# Patient Record
Sex: Female | Born: 1955
Health system: Southern US, Community
[De-identification: ages and names within clinical notes are randomized; demographics above are authoritative.]

## PROBLEM LIST (undated history)

## (undated) DIAGNOSIS — K5792 Diverticulitis of intestine, part unspecified, without perforation or abscess without bleeding: Secondary | ICD-10-CM

## (undated) DIAGNOSIS — K219 Gastro-esophageal reflux disease without esophagitis: Secondary | ICD-10-CM

## (undated) DIAGNOSIS — M109 Gout, unspecified: Secondary | ICD-10-CM

## (undated) DIAGNOSIS — I10 Essential (primary) hypertension: Secondary | ICD-10-CM

## (undated) DIAGNOSIS — E119 Type 2 diabetes mellitus without complications: Secondary | ICD-10-CM

## (undated) DIAGNOSIS — G589 Mononeuropathy, unspecified: Secondary | ICD-10-CM

## (undated) DIAGNOSIS — E1122 Type 2 diabetes mellitus with diabetic chronic kidney disease: Secondary | ICD-10-CM

## (undated) DIAGNOSIS — Z972 Presence of dental prosthetic device (complete) (partial): Secondary | ICD-10-CM

## (undated) DIAGNOSIS — N183 Chronic kidney disease, stage 3 unspecified: Secondary | ICD-10-CM

## (undated) DIAGNOSIS — G629 Polyneuropathy, unspecified: Secondary | ICD-10-CM

## (undated) HISTORY — PX: OOPHORECTOMY: SHX86

## (undated) HISTORY — PX: CHOLECYSTECTOMY: SHX55

## (undated) HISTORY — PX: ABDOMINAL HYSTERECTOMY: SHX81

---

## 2003-12-21 ENCOUNTER — Ambulatory Visit: Payer: Self-pay | Admitting: Internal Medicine

## 2005-12-26 ENCOUNTER — Ambulatory Visit: Payer: Self-pay | Admitting: Gastroenterology

## 2006-11-14 ENCOUNTER — Ambulatory Visit: Payer: Self-pay | Admitting: Internal Medicine

## 2008-05-03 ENCOUNTER — Ambulatory Visit: Payer: Self-pay | Admitting: Obstetrics and Gynecology

## 2008-05-07 ENCOUNTER — Ambulatory Visit: Payer: Self-pay | Admitting: Obstetrics and Gynecology

## 2008-12-06 ENCOUNTER — Ambulatory Visit: Payer: Self-pay | Admitting: Internal Medicine

## 2008-12-16 ENCOUNTER — Inpatient Hospital Stay (HOSPITAL_COMMUNITY): Admission: EM | Admit: 2008-12-16 | Discharge: 2008-12-21 | Payer: Self-pay | Admitting: Emergency Medicine

## 2009-02-09 ENCOUNTER — Ambulatory Visit (HOSPITAL_COMMUNITY): Admission: RE | Admit: 2009-02-09 | Discharge: 2009-02-09 | Payer: Self-pay | Admitting: General Surgery

## 2009-03-03 ENCOUNTER — Ambulatory Visit: Payer: Self-pay | Admitting: Sports Medicine

## 2009-03-15 ENCOUNTER — Ambulatory Visit: Payer: Self-pay | Admitting: Unknown Physician Specialty

## 2009-03-21 ENCOUNTER — Ambulatory Visit: Payer: Self-pay | Admitting: Unknown Physician Specialty

## 2010-05-15 LAB — DIFFERENTIAL
Eosinophils Relative: 3 % (ref 0–5)
Lymphocytes Relative: 36 % (ref 12–46)
Lymphs Abs: 2.5 10*3/uL (ref 0.7–4.0)

## 2010-05-15 LAB — CBC
HCT: 35.2 % — ABNORMAL LOW (ref 36.0–46.0)
Platelets: 407 10*3/uL — ABNORMAL HIGH (ref 150–400)
WBC: 7 10*3/uL (ref 4.0–10.5)

## 2010-05-15 LAB — BASIC METABOLIC PANEL
BUN: 20 mg/dL (ref 6–23)
GFR calc Af Amer: >60 mL/min (ref >60)
GFR calc non Af Amer: 50 mL/min — ABNORMAL LOW (ref >60)
Potassium: 4.6 mEq/L (ref 3.5–5.1)
Sodium: 137 mEq/L (ref 135–145)

## 2010-05-15 LAB — GLUCOSE, CAPILLARY
Glucose-Capillary: 100 mg/dL — ABNORMAL HIGH (ref 70–99)
Glucose-Capillary: 128 mg/dL — ABNORMAL HIGH (ref 70–99)

## 2010-05-17 LAB — CBC
HCT: 27.5 % — ABNORMAL LOW (ref 36.0–46.0)
HCT: 28.4 % — ABNORMAL LOW (ref 36.0–46.0)
HCT: 30.4 % — ABNORMAL LOW (ref 36.0–46.0)
HCT: 30.4 % — ABNORMAL LOW (ref 36.0–46.0)
HCT: 31.7 % — ABNORMAL LOW (ref 36.0–46.0)
Hemoglobin: 10 g/dL — ABNORMAL LOW (ref 12.0–15.0)
Hemoglobin: 10.1 g/dL — ABNORMAL LOW (ref 12.0–15.0)
Hemoglobin: 10.2 g/dL — ABNORMAL LOW (ref 12.0–15.0)
Hemoglobin: 10.3 g/dL — ABNORMAL LOW (ref 12.0–15.0)
Hemoglobin: 10.6 g/dL — ABNORMAL LOW (ref 12.0–15.0)
Hemoglobin: 9.1 g/dL — ABNORMAL LOW (ref 12.0–15.0)
Hemoglobin: 9.4 g/dL — ABNORMAL LOW (ref 12.0–15.0)
Hemoglobin: 9.9 g/dL — ABNORMAL LOW (ref 12.0–15.0)
MCHC: 33.2 g/dL (ref 30.0–36.0)
MCHC: 33.6 g/dL (ref 30.0–36.0)
MCHC: 33.8 g/dL (ref 30.0–36.0)
MCV: 79.5 fL (ref 78.0–100.0)
MCV: 79.7 fL (ref 78.0–100.0)
MCV: 79.7 fL (ref 78.0–100.0)
MCV: 80.2 fL (ref 78.0–100.0)
MCV: 80.3 fL (ref 78.0–100.0)
MCV: 80.3 fL (ref 78.0–100.0)
Platelets: 344 10*3/uL (ref 150–400)
Platelets: 353 10*3/uL (ref 150–400)
Platelets: 390 10*3/uL (ref 150–400)
RBC: 3.7 MIL/uL — ABNORMAL LOW (ref 3.87–5.11)
RBC: 3.75 MIL/uL — ABNORMAL LOW (ref 3.87–5.11)
RBC: 3.79 MIL/uL — ABNORMAL LOW (ref 3.87–5.11)
RBC: 3.81 MIL/uL — ABNORMAL LOW (ref 3.87–5.11)
RBC: 3.98 MIL/uL (ref 3.87–5.11)
RBC: 4.37 MIL/uL (ref 3.87–5.11)
RDW: 14.9 % (ref 11.5–15.5)
RDW: 15.1 % (ref 11.5–15.5)
RDW: 15.4 % (ref 11.5–15.5)
RDW: 15.6 % — ABNORMAL HIGH (ref 11.5–15.5)
RDW: 15.7 % — ABNORMAL HIGH (ref 11.5–15.5)
WBC: 6.9 10*3/uL (ref 4.0–10.5)
WBC: 6.9 10*3/uL (ref 4.0–10.5)
WBC: 7.5 10*3/uL (ref 4.0–10.5)
WBC: 8.1 10*3/uL (ref 4.0–10.5)
WBC: 8.4 10*3/uL (ref 4.0–10.5)

## 2010-05-17 LAB — DIFFERENTIAL
Basophils Absolute: 0.1 10*3/uL (ref 0.0–0.1)
Basophils Absolute: 0.1 10*3/uL (ref 0.0–0.1)
Eosinophils Absolute: 0.1 10*3/uL (ref 0.0–0.7)
Eosinophils Relative: 1 % (ref 0–5)
Eosinophils Relative: 1 % (ref 0–5)
Lymphocytes Relative: 42 % (ref 12–46)
Lymphs Abs: 3.8 10*3/uL (ref 0.7–4.0)
Monocytes Absolute: 0.6 10*3/uL (ref 0.1–1.0)
Monocytes Relative: 6 % (ref 3–12)
Neutro Abs: 4.4 10*3/uL (ref 1.7–7.7)

## 2010-05-17 LAB — COMPREHENSIVE METABOLIC PANEL
ALT: 11 U/L (ref 0–35)
AST: 15 U/L (ref 0–37)
AST: 17 U/L (ref 0–37)
Albumin: 3.4 g/dL — ABNORMAL LOW (ref 3.5–5.2)
Alkaline Phosphatase: 67 U/L (ref 39–117)
CO2: 20 mEq/L (ref 19–32)
CO2: 21 mEq/L (ref 19–32)
Calcium: 8.5 mg/dL (ref 8.4–10.5)
Chloride: 105 mEq/L (ref 96–112)
Chloride: 107 mEq/L (ref 96–112)
Creatinine, Ser: 1.01 mg/dL (ref 0.4–1.2)
Creatinine, Ser: 1.06 mg/dL (ref 0.4–1.2)
GFR calc Af Amer: 56 mL/min — ABNORMAL LOW (ref >60)
GFR calc Af Amer: >60 mL/min (ref >60)
GFR calc Af Amer: >60 mL/min (ref >60)
GFR calc non Af Amer: 46 mL/min — ABNORMAL LOW (ref >60)
GFR calc non Af Amer: 55 mL/min — ABNORMAL LOW (ref >60)
Glucose, Bld: 95 mg/dL (ref 70–99)
Potassium: 4.5 mEq/L (ref 3.5–5.1)
Potassium: 5.7 mEq/L — ABNORMAL HIGH (ref 3.5–5.1)
Sodium: 134 mEq/L — ABNORMAL LOW (ref 135–145)
Sodium: 137 mEq/L (ref 135–145)
Total Bilirubin: 0.5 mg/dL (ref 0.3–1.2)
Total Bilirubin: 0.8 mg/dL (ref 0.3–1.2)
Total Protein: 6.7 g/dL (ref 6.0–8.3)

## 2010-05-17 LAB — BASIC METABOLIC PANEL
Calcium: 8.9 mg/dL (ref 8.4–10.5)
GFR calc Af Amer: >60 mL/min (ref >60)
GFR calc non Af Amer: 50 mL/min — ABNORMAL LOW (ref >60)
GFR calc non Af Amer: >60 mL/min (ref >60)
Glucose, Bld: 97 mg/dL (ref 70–99)
Potassium: 3.8 mEq/L (ref 3.5–5.1)
Sodium: 138 mEq/L (ref 135–145)
Sodium: 139 mEq/L (ref 135–145)

## 2010-05-17 LAB — MAGNESIUM: Magnesium: 1.7 mg/dL (ref 1.5–2.5)

## 2010-05-17 LAB — PROTIME-INR: Prothrombin Time: 13.7 seconds (ref 11.6–15.2)

## 2010-05-17 LAB — GLUCOSE, CAPILLARY
Glucose-Capillary: 101 mg/dL — ABNORMAL HIGH (ref 70–99)
Glucose-Capillary: 104 mg/dL — ABNORMAL HIGH (ref 70–99)
Glucose-Capillary: 109 mg/dL — ABNORMAL HIGH (ref 70–99)
Glucose-Capillary: 113 mg/dL — ABNORMAL HIGH (ref 70–99)
Glucose-Capillary: 118 mg/dL — ABNORMAL HIGH (ref 70–99)
Glucose-Capillary: 131 mg/dL — ABNORMAL HIGH (ref 70–99)
Glucose-Capillary: 138 mg/dL — ABNORMAL HIGH (ref 70–99)
Glucose-Capillary: 97 mg/dL (ref 70–99)
Glucose-Capillary: 98 mg/dL (ref 70–99)

## 2010-05-17 LAB — TYPE AND SCREEN: Antibody Screen: NEGATIVE

## 2010-05-17 LAB — HEMOGLOBIN A1C: Hgb A1c MFr Bld: 6.7 % — ABNORMAL HIGH (ref 4.6–6.1)

## 2010-05-17 LAB — ABO/RH: ABO/RH(D): O POS

## 2010-06-07 ENCOUNTER — Ambulatory Visit: Payer: Self-pay | Admitting: Internal Medicine

## 2011-06-11 ENCOUNTER — Ambulatory Visit: Payer: Self-pay | Admitting: Internal Medicine

## 2011-10-17 ENCOUNTER — Ambulatory Visit: Payer: Self-pay | Admitting: Gastroenterology

## 2012-11-12 ENCOUNTER — Ambulatory Visit: Payer: Self-pay | Admitting: Internal Medicine

## 2013-09-07 DIAGNOSIS — K2100 Gastro-esophageal reflux disease with esophagitis, without bleeding: Secondary | ICD-10-CM | POA: Insufficient documentation

## 2013-09-07 DIAGNOSIS — M109 Gout, unspecified: Secondary | ICD-10-CM | POA: Insufficient documentation

## 2013-09-07 DIAGNOSIS — E039 Hypothyroidism, unspecified: Secondary | ICD-10-CM | POA: Insufficient documentation

## 2013-09-20 ENCOUNTER — Emergency Department (HOSPITAL_COMMUNITY)
Admission: EM | Admit: 2013-09-20 | Discharge: 2013-09-20 | Disposition: A | Discharge status: Home / Self Care | Payer: BC Managed Care – PPO | Attending: Emergency Medicine | Admitting: Emergency Medicine

## 2013-09-20 ENCOUNTER — Encounter (HOSPITAL_COMMUNITY): Payer: Self-pay | Admitting: Emergency Medicine

## 2013-09-20 DIAGNOSIS — Z8719 Personal history of other diseases of the digestive system: Secondary | ICD-10-CM | POA: Insufficient documentation

## 2013-09-20 DIAGNOSIS — G589 Mononeuropathy, unspecified: Secondary | ICD-10-CM | POA: Diagnosis not present

## 2013-09-20 DIAGNOSIS — I10 Essential (primary) hypertension: Secondary | ICD-10-CM | POA: Insufficient documentation

## 2013-09-20 DIAGNOSIS — R51 Headache: Secondary | ICD-10-CM | POA: Insufficient documentation

## 2013-09-20 DIAGNOSIS — M25473 Effusion, unspecified ankle: Secondary | ICD-10-CM | POA: Diagnosis not present

## 2013-09-20 DIAGNOSIS — M25476 Effusion, unspecified foot: Secondary | ICD-10-CM | POA: Diagnosis not present

## 2013-09-20 DIAGNOSIS — E119 Type 2 diabetes mellitus without complications: Secondary | ICD-10-CM | POA: Insufficient documentation

## 2013-09-20 DIAGNOSIS — M109 Gout, unspecified: Secondary | ICD-10-CM | POA: Insufficient documentation

## 2013-09-20 DIAGNOSIS — M25569 Pain in unspecified knee: Secondary | ICD-10-CM | POA: Insufficient documentation

## 2013-09-20 DIAGNOSIS — R519 Headache, unspecified: Secondary | ICD-10-CM

## 2013-09-20 DIAGNOSIS — M25561 Pain in right knee: Secondary | ICD-10-CM

## 2013-09-20 HISTORY — DX: Polyneuropathy, unspecified: G62.9

## 2013-09-20 HISTORY — DX: Type 2 diabetes mellitus without complications: E11.9

## 2013-09-20 HISTORY — DX: Essential (primary) hypertension: I10

## 2013-09-20 HISTORY — DX: Gout, unspecified: M10.9

## 2013-09-20 HISTORY — DX: Diverticulitis of intestine, part unspecified, without perforation or abscess without bleeding: K57.92

## 2013-09-20 MED ORDER — NAPROXEN 500 MG PO TABS: 500.0000 mg | ORAL_TABLET | Freq: Two times a day (BID) | ORAL | Status: DC

## 2013-09-20 MED ORDER — SODIUM CHLORIDE 0.9 % IV BOLUS (SEPSIS)
1000.0000 mL | Freq: Once | INTRAVENOUS | Status: AC
  Administered 2013-09-20: 1000 mL via INTRAVENOUS

## 2013-09-20 MED ORDER — ONDANSETRON HCL 4 MG/2ML IJ SOLN
4.0000 mg | Freq: Once | INTRAMUSCULAR | Status: AC
  Administered 2013-09-20: 4 mg via INTRAVENOUS
  Filled 2013-09-20: qty 2

## 2013-09-20 MED ORDER — HYDROMORPHONE HCL PF 1 MG/ML IJ SOLN
1.0000 mg | Freq: Once | INTRAMUSCULAR | Status: AC
  Administered 2013-09-20: 1 mg via INTRAVENOUS
  Filled 2013-09-20: qty 1

## 2013-09-20 MED ORDER — KETOROLAC TROMETHAMINE 30 MG/ML IJ SOLN
30.0000 mg | Freq: Once | INTRAMUSCULAR | Status: AC
  Administered 2013-09-20: 30 mg via INTRAVENOUS
  Filled 2013-09-20: qty 1

## 2013-09-20 MED ORDER — METOCLOPRAMIDE HCL 5 MG/ML IJ SOLN
10.0000 mg | Freq: Once | INTRAMUSCULAR | Status: AC
  Administered 2013-09-20: 10 mg via INTRAVENOUS
  Filled 2013-09-20: qty 2

## 2013-09-20 NOTE — ED Provider Notes (Signed)
CSN: [id number]     Arrival date & time 09/20/13  1044 History   First MD Initiated Contact with Patient 09/20/13 1115     Chief Complaint  Patient presents with  . Headache  . Leg Pain      The history is provided by the patient and a relative.   patient presents for 2 complaints today the first is right knee pain over the past 3 months.  She's had an arthrocentesis performed in the past.  She states she is scheduled for a knee replacement.  She's had a cortisone injection in her knee without improvement in her symptoms.  She is managed by her primary care physician with Vicodin.  She states that her pain in her knee continues to worsen despite the Vicodin.  No fevers or chills.  No weakness of her right leg.  Her second complaint is headache with associated photophobia and phonophobia over the past 2-3 days.  She reports nausea without vomiting.  She denies diarrhea.  No fevers or chills.  No neck pain or neck stiffness.  No recent injury or head trauma.  No use of anticoagulants.  She has a history of migraine headaches but it has been many years since she's had a headache like this.  It was not acute in onset.  Pain is moderate to severe in severity at this time.  No change in her vision.  No dental pain.  No upper respiratory symptoms.  No sinus discomfort    Past Medical History  Diagnosis Date  . Hypertension   . Diabetes mellitus without complication   . Gout   . Neuropathy   . Diverticulitis    Past Surgical History  Procedure Laterality Date  . Abdominal hysterectomy    . Cholecystectomy    . Cesarean section     History reviewed. No pertinent family history. History  Substance Use Topics  . Smoking status: Never Smoker   . Smokeless tobacco: Not on file  . Alcohol Use: No   OB History   Grav Para Term Preterm Abortions TAB SAB Ect Mult Living                 Review of Systems  Neurological: Positive for headaches.  All other systems reviewed and are  negative.     Allergies  Asa and Percocet  Home Medications   Prior to Admission medications   Medication Sig Start Date End Date Taking? Authorizing Provider  allopurinol (ZYLOPRIM) 300 MG tablet Take 300 mg by mouth daily.   Yes Historical Provider, MD  atenolol (TENORMIN) 50 MG tablet Take 50 mg by mouth daily.   Yes Historical Provider, MD  BIOTIN PO Take 1 tablet by mouth daily.   Yes Historical Provider, MD  Ferrous Gluconate (FERGON PO) Take 2 tablets by mouth daily.   Yes Historical Provider, MD  gabapentin (NEURONTIN) 100 MG capsule Take 200 mg by mouth 2 (two) times daily.   Yes Historical Provider, MD  HYDROcodone-acetaminophen (NORCO/VICODIN) 5-325 MG per tablet Take 1 tablet by mouth every 6 (six) hours as needed for moderate pain.   Yes Historical Provider, MD  Iron-Vitamin C (VITRON-C) 65-125 MG TABS Take 1 tablet by mouth daily.   Yes Historical Provider, MD  levothyroxine (SYNTHROID, LEVOTHROID) 75 MCG tablet Take 75 mcg by mouth daily before breakfast.   Yes Historical Provider, MD  metFORMIN (GLUCOPHAGE) 500 MG tablet Take 500 mg by mouth 2 (two) times daily with a meal.   Yes Historical  Provider, MD  olmesartan-hydrochlorothiazide (BENICAR HCT) 40-25 MG per tablet Take 0.5 tablets by mouth every other day.   Yes Historical Provider, MD  pantoprazole (PROTONIX) 40 MG tablet Take 40 mg by mouth daily.   Yes Historical Provider, MD  traMADol (ULTRAM) 50 MG tablet Take 50 mg by mouth every 6 (six) hours as needed for moderate pain.   Yes Historical Provider, MD  vitamin B-12 (CYANOCOBALAMIN) 1000 MCG tablet Take 1,000 mcg by mouth daily.   Yes Historical Provider, MD   BP 119/71  Pulse 74  Temp(Src) 99 F (37.2 C) (Oral)  Resp 12  Wt 270 lb (122.471 kg)  SpO2 97% Physical Exam  Nursing note and vitals reviewed. Constitutional: She is oriented to person, place, and time. She appears well-developed and well-nourished. No distress.  HENT:  Head: Normocephalic and  atraumatic.  Eyes: EOM are normal. Pupils are equal, round, and reactive to light.  Neck: Normal range of motion.  Cardiovascular: Normal rate, regular rhythm and normal heart sounds.   Pulmonary/Chest: Effort normal and breath sounds normal.  Abdominal: Soft. She exhibits no distension. There is no tenderness.  Musculoskeletal: Normal range of motion.  Very faint joint effusion of the right knee.  Normal PT and DP pulses in the right foot.  Able to range the right knee.  No significant warmth or erythema of the right knee.  No deformity noted.  Neurological: She is alert and oriented to person, place, and time.  5/5 strength in major muscle groups of  bilateral upper and lower extremities. Speech normal. No facial asymetry.   Skin: Skin is warm and dry.  Psychiatric: She has a normal mood and affect. Judgment normal.    ED Course  Procedures (including critical care time) Labs Review Labs Reviewed - No data to display  Imaging Review No results found.   EKG Interpretation None      MDM   Final diagnoses:  Nonintractable headache, unspecified chronicity pattern, unspecified headache type  Right knee pain    Suspect chronic arthritis as the cause of her right knee pain.  No indication for imaging in the setting of no trauma.  Will treat pain.  Regarding her headache this sounds more like a migraine variant.  No trauma.  Normal neuro exam.  No indication for imaging of the head at this time.  We'll manage with pain medicine and Reglan.  1:03 PM Improvement in HA. Close pcp follow up. Home with naprosyn x 4 days. Understands to return to ER for new or worsening symptoms  Hoy Morn, MD 09/20/13 1304

## 2013-09-20 NOTE — Discharge Instructions (Signed)

## 2013-09-20 NOTE — ED Notes (Signed)
Per pt and family pain right knee that has progressed to right hip and shoulder and now her head.

## 2013-11-12 DIAGNOSIS — E118 Type 2 diabetes mellitus with unspecified complications: Secondary | ICD-10-CM | POA: Insufficient documentation

## 2013-11-12 DIAGNOSIS — E7800 Pure hypercholesterolemia, unspecified: Secondary | ICD-10-CM | POA: Insufficient documentation

## 2013-11-12 DIAGNOSIS — I10 Essential (primary) hypertension: Secondary | ICD-10-CM | POA: Insufficient documentation

## 2013-12-11 ENCOUNTER — Ambulatory Visit: Payer: Self-pay | Admitting: Internal Medicine

## 2014-08-02 DIAGNOSIS — M170 Bilateral primary osteoarthritis of knee: Secondary | ICD-10-CM | POA: Insufficient documentation

## 2015-01-19 DIAGNOSIS — E1121 Type 2 diabetes mellitus with diabetic nephropathy: Secondary | ICD-10-CM | POA: Insufficient documentation

## 2015-03-04 ENCOUNTER — Other Ambulatory Visit: Payer: Self-pay

## 2015-04-05 ENCOUNTER — Other Ambulatory Visit: Payer: Self-pay | Admitting: Internal Medicine

## 2015-04-05 DIAGNOSIS — R0781 Pleurodynia: Secondary | ICD-10-CM

## 2015-04-07 ENCOUNTER — Ambulatory Visit: Payer: Self-pay

## 2015-04-11 ENCOUNTER — Other Ambulatory Visit: Payer: Self-pay | Admitting: Internal Medicine

## 2015-04-11 DIAGNOSIS — S22000A Wedge compression fracture of unspecified thoracic vertebra, initial encounter for closed fracture: Secondary | ICD-10-CM

## 2015-04-12 ENCOUNTER — Ambulatory Visit
Admission: RE | Admit: 2015-04-12 | Discharge: 2015-04-12 | Disposition: A | Discharge status: Home / Self Care | Payer: 59 | Source: Ambulatory Visit | Attending: Internal Medicine | Admitting: Internal Medicine

## 2015-04-12 DIAGNOSIS — M5124 Other intervertebral disc displacement, thoracic region: Secondary | ICD-10-CM | POA: Insufficient documentation

## 2015-04-12 DIAGNOSIS — S22000A Wedge compression fracture of unspecified thoracic vertebra, initial encounter for closed fracture: Secondary | ICD-10-CM | POA: Diagnosis present

## 2015-04-13 ENCOUNTER — Other Ambulatory Visit: Payer: Self-pay | Admitting: Internal Medicine

## 2015-04-13 DIAGNOSIS — R0781 Pleurodynia: Secondary | ICD-10-CM

## 2015-04-14 ENCOUNTER — Ambulatory Visit
Admission: RE | Admit: 2015-04-14 | Discharge: 2015-04-14 | Disposition: A | Discharge status: Home / Self Care | Payer: 59 | Source: Ambulatory Visit | Attending: Internal Medicine | Admitting: Internal Medicine

## 2015-04-14 DIAGNOSIS — I517 Cardiomegaly: Secondary | ICD-10-CM | POA: Diagnosis not present

## 2015-04-14 DIAGNOSIS — R0781 Pleurodynia: Secondary | ICD-10-CM | POA: Insufficient documentation

## 2015-04-14 MED ORDER — IOHEXOL 350 MG/ML SOLN
100.0000 mL | Freq: Once | INTRAVENOUS | Status: AC | PRN
  Administered 2015-04-14: 100 mL via INTRAVENOUS

## 2015-04-19 LAB — POCT I-STAT CREATININE: CREATININE: 1.3 mg/dL — AB (ref 0.44–1.00)

## 2015-05-03 ENCOUNTER — Encounter: Payer: Self-pay | Admitting: Pain Medicine

## 2015-05-03 ENCOUNTER — Ambulatory Visit: Payer: 59 | Attending: Pain Medicine | Admitting: Pain Medicine

## 2015-05-03 VITALS — BP 145/85 | HR 85 | Temp 98.1°F | Resp 11 | Ht 66.0 in | Wt 256.0 lb

## 2015-05-03 DIAGNOSIS — M4724 Other spondylosis with radiculopathy, thoracic region: Secondary | ICD-10-CM | POA: Insufficient documentation

## 2015-05-03 DIAGNOSIS — I10 Essential (primary) hypertension: Secondary | ICD-10-CM | POA: Diagnosis not present

## 2015-05-03 DIAGNOSIS — M549 Dorsalgia, unspecified: Secondary | ICD-10-CM | POA: Diagnosis present

## 2015-05-03 DIAGNOSIS — M5124 Other intervertebral disc displacement, thoracic region: Secondary | ICD-10-CM | POA: Insufficient documentation

## 2015-05-03 DIAGNOSIS — M5134 Other intervertebral disc degeneration, thoracic region: Secondary | ICD-10-CM

## 2015-05-03 DIAGNOSIS — M5414 Radiculopathy, thoracic region: Secondary | ICD-10-CM | POA: Insufficient documentation

## 2015-05-03 DIAGNOSIS — E7800 Pure hypercholesterolemia, unspecified: Secondary | ICD-10-CM | POA: Diagnosis not present

## 2015-05-03 DIAGNOSIS — E039 Hypothyroidism, unspecified: Secondary | ICD-10-CM | POA: Diagnosis not present

## 2015-05-03 DIAGNOSIS — G8929 Other chronic pain: Secondary | ICD-10-CM | POA: Insufficient documentation

## 2015-05-03 DIAGNOSIS — K5730 Diverticulosis of large intestine without perforation or abscess without bleeding: Secondary | ICD-10-CM | POA: Diagnosis not present

## 2015-05-03 DIAGNOSIS — M170 Bilateral primary osteoarthritis of knee: Secondary | ICD-10-CM | POA: Insufficient documentation

## 2015-05-03 DIAGNOSIS — Z90710 Acquired absence of both cervix and uterus: Secondary | ICD-10-CM | POA: Diagnosis not present

## 2015-05-03 DIAGNOSIS — K210 Gastro-esophageal reflux disease with esophagitis: Secondary | ICD-10-CM | POA: Insufficient documentation

## 2015-05-03 DIAGNOSIS — E1121 Type 2 diabetes mellitus with diabetic nephropathy: Secondary | ICD-10-CM | POA: Insufficient documentation

## 2015-05-03 DIAGNOSIS — K589 Irritable bowel syndrome without diarrhea: Secondary | ICD-10-CM | POA: Insufficient documentation

## 2015-05-03 DIAGNOSIS — M109 Gout, unspecified: Secondary | ICD-10-CM | POA: Diagnosis not present

## 2015-05-03 MED ORDER — ROPIVACAINE HCL 2 MG/ML IJ SOLN: 1.0000 mL | Freq: Once | INTRAMUSCULAR | Status: DC

## 2015-05-03 MED ORDER — DEXAMETHASONE SODIUM PHOSPHATE 10 MG/ML IJ SOLN
INTRAMUSCULAR | Status: AC
  Administered 2015-05-03: 11:00:00
  Filled 2015-05-03: qty 1

## 2015-05-03 MED ORDER — DEXAMETHASONE SODIUM PHOSPHATE 10 MG/ML IJ SOLN: 10.0000 mg | Freq: Once | INTRAMUSCULAR | Status: DC

## 2015-05-03 MED ORDER — IOHEXOL 180 MG/ML  SOLN
INTRAMUSCULAR | Status: AC
  Administered 2015-05-03: 11:00:00
  Filled 2015-05-03: qty 20

## 2015-05-03 MED ORDER — LIDOCAINE HCL (PF) 1 % IJ SOLN
INTRAMUSCULAR | Status: AC
  Administered 2015-05-03: 11:00:00
  Filled 2015-05-03: qty 5

## 2015-05-03 MED ORDER — IOHEXOL 180 MG/ML  SOLN: 5.0000 mL | Freq: Once | INTRAMUSCULAR | Status: DC | PRN

## 2015-05-03 MED ORDER — LIDOCAINE HCL (PF) 1 % IJ SOLN: 10.0000 mL | Freq: Once | INTRAMUSCULAR | Status: DC

## 2015-05-03 MED ORDER — SODIUM CHLORIDE 0.9% FLUSH: 1.0000 mL | Freq: Once | INTRAVENOUS | Status: DC

## 2015-05-03 MED ORDER — SODIUM CHLORIDE 0.9 % IJ SOLN
INTRAMUSCULAR | Status: AC
  Administered 2015-05-03: 11:00:00
  Filled 2015-05-03: qty 10

## 2015-05-03 MED ORDER — ROPIVACAINE HCL 2 MG/ML IJ SOLN
INTRAMUSCULAR | Status: AC
  Administered 2015-05-03: 11:00:00
  Filled 2015-05-03: qty 10

## 2015-05-03 NOTE — Progress Notes (Signed)
Patient's Name: Claudia Hunter MRN: [id number] DOB: September 17, 1955 DOS: 05/03/2015  Primary Reason(s) for Visit: Initial Patient Evaluation CC: Back Pain   HPI  Claudia Hunter is a 60 y.o. year old, female patient, who comes today for an initial evaluation. She has Complication of diabetes mellitus ([place]); Essential (primary) hypertension; Gout; Adult hypothyroidism; Macroalbuminuric diabetic nephropathy ([place]); Osteoarthritis of knee (Bilateral) (L>R); Pure hypercholesterolemia; Esophagitis, reflux; Diverticulosis of colon; Bulge of thoracic disc without myelopathy (Left paracentral disc protrusions at T5-6 and T6-7); Thoracic radiculitis (Location of Primary Source of Pain) (Left); and Thoracic spondylosis with radiculopathy (Left) on her problem list.. Her primarily concern today is the Back Pain   The patient comes into the clinics today referred to [place] as a "FAST TRACK" for a left sided thoracic epidural steroid injection. The patient comes in today indicating that her worst pain is over the left upper back and following a dermatomal distribution to the area right under her breast.  Reported Pain Score: 9 , clinically she looks like a 4-5/10. Reported level is inconsistent with clinical obrservations. Pain Type: Chronic pain Pain Location: Back Pain Orientation: Mid, Left Pain Descriptors / Indicators: Aching, Sharp, Stabbing Pain Frequency: Constant  Onset and Duration: Sudden, Date of onset: February 2017 and Present less than 3 months Cause of pain: Unknown Severity: No change since onset, NAS-11 at its worse: 10/10, NAS-11 at its best: 10/10, NAS-11 now: 10/10 and NAS-11 on the average: 10/10 Timing: Not influenced by the time of the day Aggravating Factors: Bending, Kneeling, Lifiting, Motion, Prolonged sitting, Prolonged standing, Twisting, Walking, Walking uphill, Walking downhill and Working Alleviating Factors: The patient denies any type of alleviating factors. Associated Problems: Pain  that wakes patient up and Pain that does not allow patient to sleep Quality of Pain: Aching, Agonizing, Annoying, Burning, Constant, Deep, Disabling, Distressing, Dreadful, Feeling of constriction, Horrible, Pressure-like, Pulsating, Sharp, Shooting, Stabbing and Tender Previous Examinations or Tests: CT scan and MRI scan Previous Treatments: Narcotic medications  Historic Controlled Substance Pharmacotherapy Review  Previously Prescribed Opioids: We are currently not prescribing for this patient. Analgesic: Hydrocodone/APAP 5/325 one tablet every 6 hours (20 mg/day); hydromorphone 2 mg twice a day (4 mg/day) MME/day: 46 mg/day Pharmacokinetics: Onset of action (Liberation/Absorption): Within expected pharmacological parameters Time to Peak effect (Distribution): Timing and results are as within normal expected parameters Duration of action (Metabolism/Excretion): Within normal limits for medication Pharmacodynamics: Analgesic Effect: More than 50% Activity Facilitation: Medication(s) allow patient to sit, stand, walk, and do the basic ADLs Perceived Effectiveness: Described as relatively effective, allowing for increase in activities of daily living (ADL) Side-effects or Adverse reactions: None reported Historical Background Evaluation: [place] PDMP: Five (5) year initial data search conducted. Historical Hospital-associated UDS Results:  No results found for: THCU, COCAINSCRNUR, PCPSCRNUR, MDMA, AMPHETMU, METHADONE, ETOH UDS Results: No UDS available, at this time UDS Interpretation: No UDS available, at this time Medication Assessment Form: Not applicable. Initial evaluation. The patient has not received any medications from our practice Treatment compliance: Not applicable. Initial evaluation Risk Assessment: Aberrant Behavior: None observed today and     Opioid Fatal Overdose Risk Factors: None detected today and     Substance Use Disorder (SUD) Risk Level: Not applicable Opioid Risk  Tool (ORT) Score: Total Score: 0 Low Risk for SUD (Score <3) Depression Scale Score: PHQ-2: PHQ-2 Total Score: 0 No depression (0) PHQ-9: PHQ-9 Total Score: 0 No depression (0-4)  Pharmacologic Plan: The patient was sent to [place] for interventional therapy, not pharmacotherapy.  Neuromodulation Therapy Review  Type: No neuromodulatory devices implanted Side-effects or Adverse reactions: No device reported Effectiveness: No device reported  Allergies  [name] is allergic to asa; celecoxib; clemastine; limonene; and percocet.  Meds  The patient has a current medication list which includes the following prescription(s): allopurinol, atenolol, biotin, cholecalciferol, cyanocobalamin, cyclobenzaprine, ferrous gluconate, gabapentin, hydrocodone-acetaminophen, iron-vitamin c, losartan-hydrochlorothiazide, metformin, naproxen, pantoprazole, tramadol, zolpidem, acetaminophen, allopurinol, atenolol, biotin, gabapentin, gabapentin, hydromorphone, levothyroxine, levothyroxine, losartan-hydrochlorothiazide, lovastatin, metformin, naproxen, olmesartan-hydrochlorothiazide, pantoprazole, tramadol, and vitamin b-12, and the following Facility-Administered Medications: dexamethasone, iohexol, lidocaine (pf), ropivacaine (pf) 2 mg/ml (0.2%), and sodium chloride flush. Requested Prescriptions    No prescriptions requested or ordered in this encounter    ROS  Cardiovascular History: Hypertension Pulmonary or Respiratory History: Negative for bronchial asthma, emphysema, chronic smoking, chronic bronchitis, sarcoidosis, tuberculosis or sleep apena Neurological History: Peripheral Neuropathy Psychological-Psychiatric History: Negative for anxiety, depression, schizophrenia, bipolar disorders or suicidal ideations or attempts Gastrointestinal History: Reflux or heatburn and Irritable Bowel Syndrome (IBS) Genitourinary History: Negative for nephrolithiasis, hematuria, renal failure or chronic kidney  disease Hematological History: Anemia Endocrine History: Non-insulin-dependent diabetes mellitus and Hypothyroidism Rheumatologic History: Osteoarthritis Musculoskeletal History: Negative for myasthenia gravis, muscular dystrophy, multiple sclerosis or malignant hyperthermia Work History: Working full time  [employer]  Medical:  Ms. Lomax  has a past medical history of Hypertension; Diabetes mellitus without complication ([place]); Gout; Neuropathy ([place]); and Diverticulitis. Family: family history includes Cancer in her mother; Heart disease in her father. Surgical:  has past surgical history that includes Abdominal hysterectomy; Cholecystectomy; and Cesarean section. Tobacco:  reports that she has never smoked. She does not have any smokeless tobacco history on file. Alcohol:  reports that she does not drink alcohol. Drug:  has no drug history on file.  Physical Exam  Vitals:  Today's Vitals   05/03/15 1048 05/03/15 1052 05/03/15 1057 05/03/15 1114  BP: 143/80 140/81 145/85   Pulse: 83 82 85   Temp:      Resp: 11 8 11    Height:      Weight:      SpO2: 100% 100% 100%   PainSc:    9   PainLoc:        Calculated BMI: Body mass index is 41.34 kg/(m^2).     General appearance: alert, cooperative, appears stated age, moderate distress and morbidly obese Eyes: PERLA Respiratory: No evidence respiratory distress, no audible rales or ronchi and no use of accessory muscles of respiration  Cervical Spine Inspection: Normal anatomy Alignment: Symetrical ROM: Adequate  Upper Extremities Inspection: No gross anomalies detected ROM: Adequate Sensory: Normal Motor: Unremarkable  Thoracic Spine Inspection: No gross anomalies detected Alignment: Symetrical ROM: Decreased Palpation: Exquisite tenderness to palpation over the upper left back and upper left flank.  Lumbar Spine Inspection: No gross anomalies detected Alignment: Symetrical ROM: Adequate Palpation: WNL Provocative  Tests: Lumbar Hyperextension and rotation test: deferred Patrick's Maneuver: deferred Gait: WNL  Lower Extremities Inspection: No gross anomalies detected ROM: Adequate Sensory: Normal Motor: Unremarkable   Assessment  Primary Diagnosis & Pertinent Problem List: The primary encounter diagnosis was Thoracic radiculitis (Location of Primary Source of Pain) (Left). Diagnoses of Bulge of thoracic disc without myelopathy (Left paracentral disc protrusions at T5-6 and T6-7) and Thoracic spondylosis with radiculopathy (Left) were also pertinent to this visit.  Visit Diagnosis: 1. Thoracic radiculitis (Location of Primary Source of Pain) (Left)   2. Bulge of thoracic disc without myelopathy (Left paracentral disc protrusions at T5-6 and T6-7)   3. Thoracic  spondylosis with radiculopathy (Left)     Assessment: No problem-specific assessment & plan notes found for this encounter.   Plan of Care  Note: As per protocol, today's visit has been an evaluation only. We have not taken over the patient's controlled substance management.  Pharmacotherapy (Medications Ordered): Meds ordered this encounter  Medications  . dexamethasone (DECADRON) injection 10 mg    Sig:   . iohexol (OMNIPAQUE) 180 MG/ML injection 5 mL    Sig:   . lidocaine (PF) (XYLOCAINE) 1 % injection 10 mL    Sig:   . sodium chloride flush (NS) 0.9 % injection 1 mL    Sig:   . ropivacaine (PF) 2 mg/ml (0.2%) (NAROPIN) epidural 1 mL    Sig:   . ropivacaine (PF) 2 mg/ml (0.2%) (NAROPIN) 2 MG/ML epidural    Sig:     Sharlett Iles, [place]: cabinet override  . iohexol (OMNIPAQUE) 180 MG/ML injection    Sig:     Sharlett Iles, Delores: cabinet override  . lidocaine (PF) (XYLOCAINE) 1 % injection    Sig:     Sharlett Iles, Delores: cabinet override  . sodium chloride 0.9 % injection    Sig:     Sharlett Iles, Delores: cabinet override  . dexamethasone (DECADRON) 10 MG/ML injection    Sig:     Sharlett Iles, Delores: cabinet override     Lab-work & Procedure Ordered: Orders Placed This Encounter  Procedures  . THORACIC EPIDURAL STEROID INJECTION    Scheduling Instructions:     Side: Left-sided     Sedation: No Sedation.     Timeframe: Today    Order Specific Question:  Where will this procedure be performed?    Answer:  [facility]    Imaging Ordered: None  Interventional Therapies: Scheduled: Left thoracic epidural steroid injection #1 under fluoroscopic guidance, no sedation (today). PRN Procedures: Possible series of 3.    Referral(s) or Consult(s): None at this point.  Medications administered during this visit: We administered ropivacaine (PF) 2 mg/ml (0.2%), iohexol, lidocaine (PF), sodium chloride, and dexamethasone.  Prescriptions ordered during this visit: New Prescriptions   No medications on file    Future Appointments Date Time Provider [place]  05/19/2015 1:20 PM Milinda Pointer, MD [facility] None    Primary Care Physician: Rusty Aus, MD Location: [facility] Facility Note by: Kathlen Brunswick. Dossie Arbour, M.D, DABA, DABAPM, DABPM, DABIPP, FIPP

## 2015-05-03 NOTE — Progress Notes (Signed)
Patient's Name: Claudia Hunter MRN: [id number] DOB: 24-Nov-1955 DOS: 05/03/2015  Primary Reason(s) for Visit: Interventional Pain Management Treatment. CC: Back Pain   Pre-Procedure Assessment:  Claudia Hunter is a 60 y.o. year old, female patient, seen today for interventional treatment. She has Complication of diabetes mellitus ([place]); Essential (primary) hypertension; Gout; Adult hypothyroidism; Macroalbuminuric diabetic nephropathy ([place]); Osteoarthritis of knee (Bilateral) (L>R); Pure hypercholesterolemia; Esophagitis, reflux; Diverticulosis of colon; Bulge of thoracic disc without myelopathy (Left paracentral disc protrusions at T5-6 and T6-7); Thoracic radiculitis (Location of Primary Source of Pain) (Left); and Thoracic spondylosis with radiculopathy (Left) on her problem list.. Her primarily concern today is the Back Pain   Today's Initial Pain Score: 10/10 Reported level of pain is incompatible with clinical obrservations. This may be secondary to a possible lack of understanding on how the pain scale works. Pain Type: Chronic pain Pain Location: Back Pain Orientation: Mid, Left Pain Descriptors / Indicators: Aching, Sharp, Stabbing Pain Frequency: Constant  Post-procedure Pain Score: 9   Verification of the correct person, correct site (including marking of site), and correct procedure were performed and confirmed by the patient.  Today's Vitals   05/03/15 1048 05/03/15 1052 05/03/15 1057 05/03/15 1114  BP: 143/80 140/81 145/85   Pulse: 83 82 85   Temp:      Resp: '11 8 11   '$ Height:      Weight:      SpO2: 100% 100% 100%   PainSc:    9   PainLoc:      Calculated BMI: Body mass index is 41.34 kg/(m^2). Allergies: She is allergic to asa; celecoxib; clemastine; limonene; and percocet.. Primary Diagnosis: Thoracic radiculitis [M54.14]  Procedure:  Type: Diagnostic Inter-Laminar Thoracic Epidural Block Region: Posterior Thoracolumbar Level: T5-6 Laterality: Left-Sided  Paramedial  Indications: 1. Thoracic radiculitis (Location of Primary Source of Pain) (Left)   2. Bulge of thoracic disc without myelopathy (Left paracentral disc protrusions at T5-6 and T6-7)   3. Thoracic spondylosis with radiculopathy (Left)     In addition, Claudia Hunter has Gout; Osteoarthritis of knee (Bilateral) (L>R); Bulge of thoracic disc without myelopathy (Left paracentral disc protrusions at T5-6 and T6-7); Thoracic radiculitis (Location of Primary Source of Pain) (Left); and Thoracic spondylosis with radiculopathy (Left) on her pertinent problem list.  Consent: Secured. Under the influence of no sedatives a written informed consent was obtained, after having provided information on the risks and possible complications. To fulfill our ethical and legal obligations, as recommended by the American Medical Association's Code of Ethics, we have provided information to the patient about our clinical impression; the nature and purpose of the treatment or procedure; the risks, benefits, and possible complications of the intervention; alternatives; the risk(s) and benefit(s) of the alternative treatment(s) or procedure(s); and the risk(s) and benefit(s) of doing nothing. The patient was provided information about the risks and possible complications associated with the procedure. These include, but are not limited to, failure to achieve desired goals, infection, bleeding, organ or nerve damage, allergic reactions, paralysis, and death. In the case of spinal procedures these may include, but are not limited to, failure to achieve desired goals, infection, bleeding, organ or nerve damage, allergic reactions, paralysis, and death. In addition, the patient was informed that Medicine is not an exact science; therefore, there is also the possibility of unforeseen risks and possible complications that may result in a catastrophic outcome. The patient indicated having understood very clearly. We have given the  patient no guarantees and we have made  no promises. Enough time was given to the patient to ask questions, all of which were answered to the patient's satisfaction.  Pre-Procedure Preparation: Safety Precautions: Allergies reviewed. Appropriate site, procedure, and patient were confirmed by following the Joint Commission's Universal Protocol (UP.01.01.01), in the form of a "Time Out". The patient was asked to confirm marked site and procedure, before commencing. The patient was asked about blood thinners, or active infections, both of which were denied. Patient was assessed for positional comfort and all pressure points were checked before starting procedure. Monitoring:  As per clinic protocol. Infection Control Precautions: Sterile technique used. Standard Universal Precautions were taken as recommended by the Department of [facility] for Disease Control and Prevention (CDC). Standard pre-surgical skin prep was conducted. Respiratory hygiene and cough etiquette was practiced. Hand hygiene observed. Safe injection practices and needle disposal techniques followed. SDV (single dose vial) medications used. Medications properly checked for expiration dates and contaminants. Personal protective equipment (PPE) used: Surgical mask. Sterile double glove technique. Radiation resistant gloves. Sterile surgical gloves.  Anesthesia, Analgesia, Anxiolysis: Type: Local Anesthesia Local Anesthetic: Lidocaine 1% Route: Infiltration ([place]/IM) IV Access: Declined Sedation: Declined  Indication(s): Analgesia    Description of Procedure Process:  Time-out: "Time-out" completed before starting procedure, as per protocol. Position: Prone Target Area: For Epidural Steroid injection(s), the target area is the  interlaminar space, initially targeting the lower border of the superior vertebral body lamina. Approach: Interlaminar approach. Area Prepped: Entire Posterior Thoracolumbar Region Prepping solution:  Duraprep (Iodine Povacrylex [0.7% available Iodine] and Isopropyl Alcohol, 74% w/w) Safety Precautions: Aspiration looking for blood return was conducted prior to all injections. At no point did we inject any substances, as a needle was being advanced. No attempts were made at seeking any paresthesias. Safe injection practices and needle disposal techniques used. Medications properly checked for expiration dates. SDV (single dose vial) medications used.   Description of the Procedure: Protocol guidelines were followed. The patient was placed in position over the fluoroscopy table. The target area was identified and the area prepped in the usual manner. Skin & deeper tissues infiltrated with local anesthetic. Appropriate amount of time allowed to pass for local anesthetics to take effect. The procedure needles were then advanced to the target area. The inferior aspect of the superior lamina was contacted and the needle walked caudad, until the lamina was cleared. The epidural space was identified using "loss-of-resistance technique" with 0.9% PF-NSS (2-28m), in a low friction 10cc LOR glass syringe. Proper needle placement was secured. Negative aspiration confirmed. Solution injected in intermittent fashion, asking for systemic symptoms every 0.5 cc of injectate. The needles were then removed and the area cleansed, making sure to leave some of the prepping solution behind to take advantage of its long term bactericidal properties. EBL: None Materials & Medications Used:  Needle(s) Used: 20g - 10cm, Tuohy-style epidural needle Medication(s): Please see chart orders for medication and dosing details.  Imaging Guidance:  Type of Imaging Technique: Fluoroscopy Guidance (Spinal) Indication(s): Assistance in needle guidance and placement for procedures requiring needle placement in or near specific anatomical locations not easily accessible without such assistance. Exposure Time: Please see nurses  notes. Contrast: Before injecting any contrast, we confirmed that the patient did not have an allergy to iodine, shellfish, or radiological contrast. Once satisfactory needle placement was completed at the desired level, radiological contrast was injected. Injection was conducted under continuous fluoroscopic guidance. Injection of contrast accomplished without complications. See chart for type and volume of contrast used.  Fluoroscopic Guidance: I was personally present in the fluoroscopy suite, where the patient was placed in position for the procedure, over the fluoroscopy-compatible table. Fluoroscopy was manipulated, using "Tunnel Vision Technique", to obtain the best possible view of the target area, on the affected side. Parallax error was corrected before commencing the procedure. A "direction-depth-direction" technique was used to introduce the needle under continuous pulsed fluoroscopic guidance. Once the target was reached, antero-posterior, oblique, and lateral fluoroscopic projection views were taken to confirm needle placement in all planes. Permanently recorded images stored by scanning into EMR. Interpretation: Intraoperative imaging interpretation by performing Physician. Adequate needle placement confirmed. Adequate needle placement confirmed in AP, lateral, & Oblique Views. Appropriate spread of contrast to desired area. No evidence of afferent or efferent intravascular uptake. No intrathecal or subarachnoid spread observed. Permanent hardcopy images in multiple planes scanned into the patient's record.  Antibiotic Prophylaxis:  Indication(s): No indications identified. Type:  Antibiotics Given (last 72 hours)    None       Post-operative Assessment:  Complications: No immediate post-treatment complications were observed. Disposition: Return to clinic for follow-up evaluation. The patient tolerated the entire procedure well. A repeat set of vitals were taken after the procedure and  the patient was kept under observation following institutional policy, for this type of procedure. Post-procedural neurological assessment was performed, showing return to baseline, prior to discharge. The patient was discharged home, once institutional criteria were met. The patient was provided with post-procedure discharge instructions, including a section on how to identify potential problems. Should any problems arise concerning this procedure, the patient was given instructions to immediately contact us, at any time, without hesitation. In any case, we plan to contact the patient by telephone for a follow-up status report regarding this interventional procedure. Comments:  No additional relevant information.  Medications administered during this visit: We administered ropivacaine (PF) 2 mg/ml (0.2%), iohexol, lidocaine (PF), sodium chloride, and dexamethasone.  Prescriptions ordered during this visit: New Prescriptions   No medications on file    Future Appointments Date Time Provider [place]  05/19/2015 1:20 PM Milinda Pointer, MD [facility] None    Primary Care Physician: Rusty Aus, MD Location: [facility] Out[facility] Note by: Kathlen Brunswick. Dossie Arbour, M.D, DABA, DABAPM, DABPM, DABIPP, FIPP  Disclaimer:  Medicine is not an exact science. The only guarantee in medicine is that nothing is guaranteed. It is important to note that the decision to proceed with this intervention was based on the information collected from the patient. The Data and conclusions were drawn from the patient's questionnaire, the interview, and the physical examination. Because the information was provided in large part by the patient, it cannot be guaranteed that it has not been purposely or unconsciously manipulated. Every effort has been made to obtain as much relevant data as possible for this evaluation. It is important to note that the conclusions that lead to this procedure are  derived in large part from the available data. Always take into account that the treatment will also be dependent on availability of resources and existing treatment guidelines, considered by other Pain Management Practitioners as being common knowledge and practice, at the time of the intervention. For Medico-Legal purposes, it is also important to point out that variation in procedural techniques and pharmacological choices are the acceptable norm. The indications, contraindications, technique, and results of the above procedure should only be interpreted and judged by a Board-Certified Interventional Pain Specialist with extensive familiarity and expertise in the same exact  procedure and technique. Attempts at providing opinions without similar or greater experience and expertise than that of the treating physician will be considered as inappropriate and unethical, and shall result in a formal complaint to the state medical board and applicable specialty societies.

## 2015-05-03 NOTE — Patient Instructions (Signed)
Epidural Steroid Injection Patient Information  Description: The epidural space surrounds the nerves as they exit the spinal cord.  In some patients, the nerves can be compressed and inflamed by a bulging disc or a tight spinal canal (spinal stenosis).  By injecting steroids into the epidural space, we can bring irritated nerves into direct contact with a potentially helpful medication.  These steroids act directly on the irritated nerves and can reduce swelling and inflammation which often leads to decreased pain.  Epidural steroids may be injected anywhere along the spine and from the neck to the low back depending upon the location of your pain.   After numbing the skin with local anesthetic (like Novocaine), a small needle is passed into the epidural space slowly.  You may experience a sensation of pressure while this is being done.  The entire block usually last less than 10 minutes.  Conditions which may be treated by epidural steroids:   Low back and leg pain  Neck and arm pain  Spinal stenosis  Post-laminectomy syndrome  Herpes zoster (shingles) pain  Pain from compression fractures  Preparation for the injection:  1. Do not eat any solid food or dairy products within 8 hours of your appointment.  2. You may drink clear liquids up to 3 hours before appointment.  Clear liquids include water, black coffee, juice or soda.  No milk or cream please. 3. You may take your regular medication, including pain medications, with a sip of water before your appointment  Diabetics should hold regular insulin (if taken separately) and take 1/2 normal NPH dos the morning of the procedure.  Carry some sugar containing items with you to your appointment. 4. A driver must accompany you and be prepared to drive you home after your procedure.  5. Bring all your current medications with your. 6. An IV may be inserted and sedation may be given at the discretion of the physician.   7. A blood pressure  cuff, EKG and other monitors will often be applied during the procedure.  Some patients may need to have extra oxygen administered for a short period. 8. You will be asked to provide medical information, including your allergies, prior to the procedure.  We must know immediately if you are taking blood thinners (like Coumadin/Warfarin)  Or if you are allergic to IV iodine contrast (dye). We must know if you could possible be pregnant.  Possible side-effects:  Bleeding from needle site  Infection (rare, may require surgery)  Nerve injury (rare)  Numbness & tingling (temporary)  Difficulty urinating (rare, temporary)  Spinal headache ( a headache worse with upright posture)  Light -headedness (temporary)  Pain at injection site (several days)  Decreased blood pressure (temporary)  Weakness in arm/leg (temporary)  Pressure sensation in back/neck (temporary)  Call if you experience:  Fever/chills associated with headache or increased back/neck pain.  Headache worsened by an upright position.  New onset weakness or numbness of an extremity below the injection site  Hives or difficulty breathing (go to the emergency room)  Inflammation or drainage at the infection site  Severe back/neck pain  Any new symptoms which are concerning to you  Please note:  Although the local anesthetic injected can often make your back or neck feel good for several hours after the injection, the pain will likely return.  It takes 3-7 days for steroids to work in the epidural space.  You may not notice any pain relief for at least that one week.    If effective, we will often do a series of three injections spaced 3-6 weeks apart to maximally decrease your pain.  After the initial series, we generally will wait several months before considering a repeat injection of the same type.  If you have any questions, please call [phone] [facility]  Discharge Instructions  General Discharge Instructions :  If you need to reach your doctor call: Monday-Friday 8:00 am - 4:00 pm at [phone] or toll free [phone].  After clinic hours [phone] to have operator reach doctor.  Bring all of your medication bottles to all your appointments in the pain clinic.  To cancel or reschedule your appointment with Pain Management please remember to call 24 hours in advance to avoid a fee.  Refer to the educational materials which you have been given on: General Risks, I had my Procedure. Discharge Instructions, Post Sedation.  Post Procedure Instructions:  The drugs you were given will stay in your system until tomorrow, so for the next 24 hours you should not drive, make any legal decisions or drink any alcoholic beverages.  You may eat anything you prefer, but it is better to start with liquids then soups and crackers, and gradually work up to solid foods.  Please notify your doctor immediately if you have any unusual bleeding, trouble breathing or pain that is not related to your normal pain.  Depending on the type of procedure that was done, some parts of your body may feel week and/or numb.  This usually clears up by tonight or the next day.  Walk with the use of an assistive device or accompanied by an adult for the 24 hours.  You may use ice on the affected area for the first 24 hours.  Put ice in a Ziploc bag and cover with a towel and place against area 15 minutes on 15 minutes off.  You may switch to heat after 24 hours. 

## 2015-05-03 NOTE — Progress Notes (Signed)
Safety precautions to be maintained throughout the outpatient stay will include: orient to surroundings, keep bed in low position, maintain call bell within reach at all times, provide assistance with transfer out of bed and ambulation.  

## 2015-05-04 ENCOUNTER — Telehealth: Payer: Self-pay | Admitting: *Deleted

## 2015-05-04 NOTE — Telephone Encounter (Signed)
Message left

## 2015-05-19 ENCOUNTER — Ambulatory Visit: Payer: 59 | Attending: Pain Medicine | Admitting: Pain Medicine

## 2015-05-19 ENCOUNTER — Encounter: Payer: Self-pay | Admitting: Pain Medicine

## 2015-05-19 VITALS — BP 140/82 | HR 79 | Temp 98.6°F | Resp 16 | Ht 66.0 in | Wt 256.0 lb

## 2015-05-19 DIAGNOSIS — E7800 Pure hypercholesterolemia, unspecified: Secondary | ICD-10-CM | POA: Diagnosis not present

## 2015-05-19 DIAGNOSIS — E1121 Type 2 diabetes mellitus with diabetic nephropathy: Secondary | ICD-10-CM | POA: Insufficient documentation

## 2015-05-19 DIAGNOSIS — K5730 Diverticulosis of large intestine without perforation or abscess without bleeding: Secondary | ICD-10-CM | POA: Diagnosis not present

## 2015-05-19 DIAGNOSIS — Z7984 Long term (current) use of oral hypoglycemic drugs: Secondary | ICD-10-CM | POA: Insufficient documentation

## 2015-05-19 DIAGNOSIS — M4724 Other spondylosis with radiculopathy, thoracic region: Secondary | ICD-10-CM | POA: Diagnosis not present

## 2015-05-19 DIAGNOSIS — M170 Bilateral primary osteoarthritis of knee: Secondary | ICD-10-CM | POA: Diagnosis not present

## 2015-05-19 DIAGNOSIS — M47894 Other spondylosis, thoracic region: Secondary | ICD-10-CM | POA: Diagnosis not present

## 2015-05-19 DIAGNOSIS — M5414 Radiculopathy, thoracic region: Secondary | ICD-10-CM

## 2015-05-19 DIAGNOSIS — E039 Hypothyroidism, unspecified: Secondary | ICD-10-CM | POA: Insufficient documentation

## 2015-05-19 DIAGNOSIS — K210 Gastro-esophageal reflux disease with esophagitis: Secondary | ICD-10-CM | POA: Diagnosis not present

## 2015-05-19 DIAGNOSIS — M109 Gout, unspecified: Secondary | ICD-10-CM | POA: Insufficient documentation

## 2015-05-19 DIAGNOSIS — I10 Essential (primary) hypertension: Secondary | ICD-10-CM | POA: Insufficient documentation

## 2015-05-19 DIAGNOSIS — M5124 Other intervertebral disc displacement, thoracic region: Secondary | ICD-10-CM | POA: Diagnosis not present

## 2015-05-19 DIAGNOSIS — Z6841 Body Mass Index (BMI) 40.0 and over, adult: Secondary | ICD-10-CM | POA: Insufficient documentation

## 2015-05-19 DIAGNOSIS — M549 Dorsalgia, unspecified: Secondary | ICD-10-CM | POA: Diagnosis present

## 2015-05-19 NOTE — Progress Notes (Signed)
Safety precautions to be maintained throughout the outpatient stay will include: orient to surroundings, keep bed in low position, maintain call bell within reach at all times, provide assistance with transfer out of bed and ambulation. Patient  Did not bring medications today.

## 2015-05-19 NOTE — Progress Notes (Signed)
Patient's Name: Claudia Hunter Patient type: Established  MRN: [id number] Service setting: Ambulatory outpatient  DOB: 10-20-1955   DOS: 05/19/2015    Primary Reason(s) for Visit: Encounter for post-procedure evaluation of chronic illness with mild to moderate exacerbation CC: Back Pain   HPI  Ms. Claudia Hunter is a 60 y.o. year old, female patient, who returns today as an established patient. She has Complication of diabetes mellitus (HCC); Essential (primary) hypertension; Gout; Adult hypothyroidism; Macroalbuminuric diabetic nephropathy (HCC); Osteoarthritis of knee (Bilateral) (L>R); Pure hypercholesterolemia; Esophagitis, reflux; Diverticulosis of colon; Bulge of thoracic disc without myelopathy (Left paracentral disc protrusions at T5-6 and T6-7); Thoracic radiculitis (Location of Primary Source of Pain) (Left); and Thoracic spondylosis with radiculopathy (Left) on her problem list.. Her primarily concern today is the Back Pain   Pain Assessment: Self-Reported Pain Score: 2  Reported level is compatible with observation Pain Type: Chronic pain Pain Location: Back Pain Orientation: Mid Pain Descriptors / Indicators: Aching, Constant Pain Frequency: Constant  The patient returns to the clinics today after last having been seen on 05/03/2015 at which time we did a T5-6 left sided thoracic epidural steroid injection under fluoroscopic guidance. This has provided the patient with considerable relief of the pain and we have decided to complete the series of 3. Today we will be scheduling her to return for her second injection. Today the patient requested some muscle relaxants and pain medication. The request was declined since the patient was referred to also has a "Fast-Track" referral. And at this time per referral, the patient is jumped over all of the other patients waiting to be seen, for the purpose of providing the patient with a very specific treatment. In order to do this, patients are not  offered medication management and they are also not evaluated to enter arm chronic pharmacological management program. The patient was informed that we would be more than glad to consider her for our medication management program as soon as we get a referral from her primary care physician for that aspect of our practice.  Date of Last Visit: 05/03/15 Service Provided on Last Visit: Procedure (left thoracic eipidural  with no sedation)  Post-Procedure Assessment  Procedure done on last visit: Left-sided T5-6 thoracic epidural steroid injection under fluoroscopic guidance, without IV sedation. Side-effects or Adverse reactions: None reported Sedation: No sedation used  Results: Ultra-Short Term Relief (First 1 hour after procedure): 10 %  No IV Analgesics or Anxiolytics given, therefore the benefit is completely due to Local Anesthetics Short Term Relief (Initial 4-6 hrs after procedure): 20 % Partial relief would suggest incomplete involvement of injected area Long Term Relief : 80 % Long-term benefit would suggest an inflammatory etiology to the pain   Current Relief (Now): 80%  Persistent relief would suggest effective anti-inflammatory effects from steroids Interpretation of Results: The results of this diagnostic injection would suggest that the patient may benefit from a series of 3. We will be scheduling the patient for this as soon as possible.  Laboratory Chemistry  Inflammation Markers No results found for: ESRSEDRATE, CRP  Renal Function Lab Results  Component Value Date   BUN 20 02/07/2009   CREATININE 1.30* 04/14/2015   GFRAA  02/07/2009    >60        The eGFR has been calculated using the MDRD equation. This calculation has not been validated in all clinical situations. eGFR's persistently <60 mL/min signify possible Chronic Kidney Disease.   GFRNONAA 50* 02/07/2009    Hepatic Function Lab  Results  Component Value Date   AST 17 12/17/2008   ALT 12 12/17/2008    ALBUMIN 3.4* 12/17/2008    Electrolytes Lab Results  Component Value Date   NA 137 02/07/2009   K 4.6 02/07/2009   CL 103 02/07/2009   CALCIUM 9.3 02/07/2009   MG 1.7 12/17/2008    Pain Modulating Vitamins No results found for: Ollie, [id number], BS9628ZM6, [id number], VITAMINB12  Coagulation Parameters Lab Results  Component Value Date   INR 1.06 12/15/2008   LABPROT 13.7 12/15/2008    Note: I personally reviewed the above data. Results shared with patient.  Meds  The patient has a current medication list which includes the following prescription(s): allopurinol, atenolol, biotin, cholecalciferol, cyanocobalamin, cyclobenzaprine, ferrous gluconate, gabapentin, hydrocodone-acetaminophen, iron-vitamin c, losartan-hydrochlorothiazide, metformin, naproxen, pantoprazole, tramadol, and zolpidem.  Current Outpatient Prescriptions on File Prior to Visit  Medication Sig  . allopurinol (ZYLOPRIM) 300 MG tablet Take 300 mg by mouth daily.  Marland Kitchen atenolol (TENORMIN) 50 MG tablet Take 50 mg by mouth daily.  Marland Kitchen BIOTIN PO Take 1 tablet by mouth daily.  . Cholecalciferol (VITAMIN D-1000 MAX ST) 1000 units tablet Take by mouth.  . Cyanocobalamin (RA VITAMIN B-12 TR) 1000 MCG TBCR Take by mouth.  . cyclobenzaprine (FLEXERIL) 10 MG tablet take 1 tablet by mouth twice a day for MUSCLES  . Ferrous Gluconate (FERGON PO) Take 2 tablets by mouth daily.  Marland Kitchen gabapentin (NEURONTIN) 300 MG capsule take 1 capsule by mouth three times a day  . HYDROcodone-acetaminophen (NORCO/VICODIN) 5-325 MG per tablet Take 1 tablet by mouth every 6 (six) hours as needed for moderate pain.  . Iron-Vitamin C (VITRON-C) 65-125 MG TABS Take 1 tablet by mouth daily.  Marland Kitchen losartan-hydrochlorothiazide (HYZAAR) 100-25 MG tablet Take by mouth.  . metFORMIN (GLUCOPHAGE) 500 MG tablet Take 500 mg by mouth 2 (two) times daily with a meal.  . naproxen (NAPROSYN) 500 MG tablet Take 1 tablet (500 mg total) by mouth 2 (two) times daily.   . pantoprazole (PROTONIX) 40 MG tablet Take 40 mg by mouth daily.  . traMADol (ULTRAM) 50 MG tablet Take 50 mg by mouth every 6 (six) hours as needed for moderate pain.  Marland Kitchen zolpidem (AMBIEN) 10 MG tablet Take by mouth.   No current facility-administered medications on file prior to visit.    ROS  Constitutional: Afebrile, no chills, well hydrated and well nourished Gastrointestinal: No upper or lower GI bleeding, no nausea, no vomiting and no acute GI distress Musculoskeletal: No acute joint swelling or redness, no acute loss of range of motion and no acute onset weakness Neurological: Denies any acute onset apraxia, no episodes of paralysis, no acute loss of coordination, no acute loss of consciousness and no acute onset aphasia, dysarthria, agnosia, or amnesia  Allergies  Ms. Langhorst is allergic to asa; celecoxib; clemastine; limonene; and percocet.  [place]  Medical:  Ms. Barnick  has a past medical history of Hypertension; Diabetes mellitus without complication ([place]); Gout; Neuropathy ([place]); and Diverticulitis. Family: family history includes Cancer in her mother; Heart disease in her father. Surgical:  has past surgical history that includes Abdominal hysterectomy; Cholecystectomy; and Cesarean section. Tobacco:  reports that she has never smoked. She does not have any smokeless tobacco history on file. Alcohol:  reports that she does not drink alcohol. Drug:  has no drug history on file.  Physical Examination  Constitutional Vitals:  Today's Vitals   05/19/15 1409 05/19/15 1411  BP: 140/82   Pulse: 79  Temp: 98.6 F (37 C)   TempSrc: Oral   Resp: 16   Height: '5\' 6"'$  (1.676 m)   Weight: 256 lb (116.121 kg)   SpO2: 100%   PainSc: 2  2   PainLoc: Back     Calculated BMI: Body mass index is 41.34 kg/(m^2). Extreme obesity (Class III) (>40 kg/m2) - 254% higher incidence of chronic pain General appearance: alert, cooperative, appears stated age, no distress and morbidly  obese Eyes: PERLA Respiratory: No evidence respiratory distress, no audible rales or ronchi and no use of accessory muscles of respiration  Cervical Spine Exam  Inspection: Normal anatomy, no anomalies observed Cervical Lordosis: Normal Alignment: Symetrical Functional ROM: Within functional limits (WFL) AROM: WFL Sensory: No sensory abnormalities reported  Upper Extremity Exam   Right  Left  Inspection: No gross anomalies detected Functional ROM: Within functional limits [facility])  Inspection: No gross anomalies detected Functional ROM: Within functional limits (WFL)  AROM: Adequate  AROM: Adequate  Sensory: Normal  Sensory: Normal  Motor: Unremarkable       Motor: Unremarkable        Thoracic Spine  Inspection: No gross anomalies detected Alignment: Symetrical AROM: Adequate  Sensory: Tender to palpation around the T5-6 area over the spine and the paravertebral muscles.  Lumbar Spne  Inspection: No gross anomalies detected Lumbar Lordosis: Normal Sacral Kyphosis: Normal Alignment: Symetrical Palpation: WNL AROM: Adequate Provocative Tests:  Lumbar Hyperextension and rotation test:  deferred Patrick's Maneuver: deferred  Gait Assessment  Gait: WNL  Lower Extremities   Right  Left  Inspection: No gross anomalies detected Functional ROM: Within functional limits [facility])  Inspection: No gross anomalies detected Functional ROM: Within functional limits (WFL)  AROM: Adequate  AROM: Adequate  Sensory:  Normal  Sensory:  Normal  Motor: Unremarkable       Motor: Unremarkable        Assessment & Plan  Primary Diagnosis & Pertinent Problem List: The primary encounter diagnosis was Thoracic radiculitis (Location of Primary Source of Pain) (Left). A diagnosis of Thoracic spondylosis with radiculopathy (Left) was also pertinent to this visit.  Visit Diagnosis: 1. Thoracic radiculitis (Location of Primary Source of Pain) (Left)   2. Thoracic spondylosis with radiculopathy  (Left)     Problem-specific Plan(s): No problem-specific assessment & plan notes found for this encounter.   Plan of Care  Pharmacotherapy (Medications Ordered): No orders of the defined types were placed in this encounter.  The patient has requested pain medications which were denied due to the fact that she was sent through a "fast-track" referral system. The patient was informed that should the primary care physician want [place] to take over, we will need a referral for that particular service.  Lab-work & Procedure Ordered: Orders Placed This Encounter  Procedures  . THORACIC EPIDURAL STEROID INJECTION    Standing Status: Future     Number of Occurrences:      Standing Expiration Date: 05/18/2016    Scheduling Instructions:     Side: Left-sided     Sedation: No Sedation.     Timeframe: ASAA    Order Specific Question:  Where will this procedure be performed?    Answer:  [facility]    Imaging Ordered: None  Interventional Therapies: Scheduled:  Left sided T5-6 thoracic epidural steroid injection #2 under fluoroscopic guidance, no sedation.    Considering:  We'll plan to complete the series of 3.    PRN Procedures:  None at this time.  Referral(s) or Consult(s): None at this time.  Medications administered during this visit: Ms. Gramlich had no medications administered during this visit.  Future Appointments Date Time Provider [place]  05/24/2015 9:45 AM Milinda Pointer, MD [facility] None    Primary Care Physician: Rusty Aus, MD Location: [facility] Outpatient Pain Management Facility Note by: Kathlen Brunswick. Dossie Arbour, M.D, DABA, DABAPM, DABPM, DABIPP, FIPP  Pain Score Disclaimer: We use the NRS-11 scale. This is a self-reported, subjective measurement of pain severity with only modest accuracy. It is used primarily to identify changes within a particular patient. It must be understood that outpatient pain scales are significantly less accurate that  those used for research, where they can be applied under ideal controlled circumstances with minimal exposure to variables. In reality, the score is likely to be a combination of pain intensity and pain affect, where pain affect describes the degree of emotional arousal or changes in action readiness caused by the sensory experience of pain. Factors such as social and work situation, setting, emotional state, anxiety levels, expectation, and prior pain experience may influence pain perception and show large inter-individual differences that may also be affected by time variables.

## 2015-05-19 NOTE — Patient Instructions (Signed)
PROCEDURE IS SCHEDULED: THORACIC EPIDURAL STEROID INJECTION WITH NO SEDATION.   Epidural Steroid Injection Patient Information  Description: The epidural space surrounds the nerves as they exit the spinal cord.  In some patients, the nerves can be compressed and inflamed by a bulging disc or a tight spinal canal (spinal stenosis).  By injecting steroids into the epidural space, we can bring irritated nerves into direct contact with a potentially helpful medication.  These steroids act directly on the irritated nerves and can reduce swelling and inflammation which often leads to decreased pain.  Epidural steroids may be injected anywhere along the spine and from the neck to the low back depending upon the location of your pain.   After numbing the skin with local anesthetic (like Novocaine), a small needle is passed into the epidural space slowly.  You may experience a sensation of pressure while this is being done.  The entire block usually last less than 10 minutes.  Conditions which may be treated by epidural steroids:   Low back and leg pain  Neck and arm pain  Spinal stenosis  Post-laminectomy syndrome  Herpes zoster (shingles) pain  Pain from compression fractures  Preparation for the injection:  1. Do not eat any solid food or dairy products within 8 hours of your appointment.  2. You may drink clear liquids up to 3 hours before appointment.  Clear liquids include water, black coffee, juice or soda.  No milk or cream please. 3. You may take your regular medication, including pain medications, with a sip of water before your appointment  Diabetics should hold regular insulin (if taken separately) and take 1/2 normal NPH dos the morning of the procedure.  Carry some sugar containing items with you to your appointment. 4. A driver must accompany you and be prepared to drive you home after your procedure.  5. Bring all your current medications with your. 6. An IV may be inserted and  sedation may be given at the discretion of the physician.   7. A blood pressure cuff, EKG and other monitors will often be applied during the procedure.  Some patients may need to have extra oxygen administered for a short period. 8. You will be asked to provide medical information, including your allergies, prior to the procedure.  We must know immediately if you are taking blood thinners (like Coumadin/Warfarin)  Or if you are allergic to IV iodine contrast (dye). We must know if you could possible be pregnant.  Possible side-effects:  Bleeding from needle site  Infection (rare, may require surgery)  Nerve injury (rare)  Numbness & tingling (temporary)  Difficulty urinating (rare, temporary)  Spinal headache ( a headache worse with upright posture)  Light -headedness (temporary)  Pain at injection site (several days)  Decreased blood pressure (temporary)  Weakness in arm/leg (temporary)  Pressure sensation in back/neck (temporary)  Call if you experience:  Fever/chills associated with headache or increased back/neck pain.  Headache worsened by an upright position.  New onset weakness or numbness of an extremity below the injection site  Hives or difficulty breathing (go to the emergency room)  Inflammation or drainage at the infection site  Severe back/neck pain  Any new symptoms which are concerning to you  Please note:  Although the local anesthetic injected can often make your back or neck feel good for several hours after the injection, the pain will likely return.  It takes 3-7 days for steroids to work in the epidural space.  You may  not notice any pain relief for at least that one week.  If effective, we will often do a series of three injections spaced 3-6 weeks apart to maximally decrease your pain.  After the initial series, we generally will wait several months before considering a repeat injection of the same type.  If you have any questions, please  call [phone] [facility]

## 2015-05-24 ENCOUNTER — Encounter: Payer: Self-pay | Admitting: Pain Medicine

## 2015-05-24 ENCOUNTER — Ambulatory Visit: Payer: 59 | Attending: Pain Medicine | Admitting: Pain Medicine

## 2015-05-24 VITALS — BP 120/89 | HR 84 | Temp 98.2°F | Resp 20 | Ht 66.0 in | Wt 256.0 lb

## 2015-05-24 DIAGNOSIS — G8929 Other chronic pain: Secondary | ICD-10-CM | POA: Diagnosis not present

## 2015-05-24 DIAGNOSIS — M5124 Other intervertebral disc displacement, thoracic region: Secondary | ICD-10-CM

## 2015-05-24 DIAGNOSIS — M109 Gout, unspecified: Secondary | ICD-10-CM | POA: Insufficient documentation

## 2015-05-24 DIAGNOSIS — M4724 Other spondylosis with radiculopathy, thoracic region: Secondary | ICD-10-CM | POA: Diagnosis not present

## 2015-05-24 DIAGNOSIS — K5730 Diverticulosis of large intestine without perforation or abscess without bleeding: Secondary | ICD-10-CM | POA: Insufficient documentation

## 2015-05-24 DIAGNOSIS — K210 Gastro-esophageal reflux disease with esophagitis: Secondary | ICD-10-CM | POA: Diagnosis not present

## 2015-05-24 DIAGNOSIS — E1121 Type 2 diabetes mellitus with diabetic nephropathy: Secondary | ICD-10-CM | POA: Insufficient documentation

## 2015-05-24 DIAGNOSIS — Z6841 Body Mass Index (BMI) 40.0 and over, adult: Secondary | ICD-10-CM | POA: Insufficient documentation

## 2015-05-24 DIAGNOSIS — M170 Bilateral primary osteoarthritis of knee: Secondary | ICD-10-CM | POA: Diagnosis not present

## 2015-05-24 DIAGNOSIS — M5414 Radiculopathy, thoracic region: Secondary | ICD-10-CM | POA: Diagnosis not present

## 2015-05-24 DIAGNOSIS — M549 Dorsalgia, unspecified: Secondary | ICD-10-CM | POA: Diagnosis present

## 2015-05-24 DIAGNOSIS — E7800 Pure hypercholesterolemia, unspecified: Secondary | ICD-10-CM | POA: Insufficient documentation

## 2015-05-24 DIAGNOSIS — M47894 Other spondylosis, thoracic region: Secondary | ICD-10-CM | POA: Diagnosis not present

## 2015-05-24 DIAGNOSIS — I10 Essential (primary) hypertension: Secondary | ICD-10-CM | POA: Insufficient documentation

## 2015-05-24 DIAGNOSIS — E039 Hypothyroidism, unspecified: Secondary | ICD-10-CM | POA: Insufficient documentation

## 2015-05-24 DIAGNOSIS — M5134 Other intervertebral disc degeneration, thoracic region: Secondary | ICD-10-CM

## 2015-05-24 MED ORDER — LIDOCAINE HCL (PF) 1 % IJ SOLN
INTRAMUSCULAR | Status: AC
  Administered 2015-05-24: 10:00:00
  Filled 2015-05-24: qty 5

## 2015-05-24 MED ORDER — SODIUM CHLORIDE 0.9 % IJ SOLN
INTRAMUSCULAR | Status: AC
  Administered 2015-05-24: 10:00:00
  Filled 2015-05-24: qty 10

## 2015-05-24 MED ORDER — ROPIVACAINE HCL 2 MG/ML IJ SOLN: 1.0000 mL | Freq: Once | INTRAMUSCULAR | Status: DC

## 2015-05-24 MED ORDER — IOPAMIDOL (ISOVUE-M 200) INJECTION 41%: 5.0000 mL | Freq: Once | INTRAMUSCULAR | Status: DC | PRN

## 2015-05-24 MED ORDER — ROPIVACAINE HCL 2 MG/ML IJ SOLN
INTRAMUSCULAR | Status: AC
  Administered 2015-05-24: 10:00:00
  Filled 2015-05-24: qty 10

## 2015-05-24 MED ORDER — LIDOCAINE HCL (PF) 1 % IJ SOLN: 10.0000 mL | Freq: Once | INTRAMUSCULAR | Status: DC

## 2015-05-24 MED ORDER — IOPAMIDOL (ISOVUE-M 200) INJECTION 41%
INTRAMUSCULAR | Status: AC
  Administered 2015-05-24: 10:00:00
  Filled 2015-05-24: qty 10

## 2015-05-24 MED ORDER — DEXAMETHASONE SODIUM PHOSPHATE 10 MG/ML IJ SOLN: 10.0000 mg | Freq: Once | INTRAMUSCULAR | Status: DC

## 2015-05-24 MED ORDER — DEXAMETHASONE SODIUM PHOSPHATE 10 MG/ML IJ SOLN
INTRAMUSCULAR | Status: AC
  Administered 2015-05-24: 10:00:00
  Filled 2015-05-24: qty 1

## 2015-05-24 MED ORDER — SODIUM CHLORIDE 0.9% FLUSH: 1.0000 mL | Freq: Once | INTRAVENOUS | Status: DC

## 2015-05-24 NOTE — Patient Instructions (Signed)
Epidural Steroid Injection Patient Information  Description: The epidural space surrounds the nerves as they exit the spinal cord.  In some patients, the nerves can be compressed and inflamed by a bulging disc or a tight spinal canal (spinal stenosis).  By injecting steroids into the epidural space, we can bring irritated nerves into direct contact with a potentially helpful medication.  These steroids act directly on the irritated nerves and can reduce swelling and inflammation which often leads to decreased pain.  Epidural steroids may be injected anywhere along the spine and from the neck to the low back depending upon the location of your pain.   After numbing the skin with local anesthetic (like Novocaine), a small needle is passed into the epidural space slowly.  You may experience a sensation of pressure while this is being done.  The entire block usually last less than 10 minutes.  Conditions which may be treated by epidural steroids:   Low back and leg pain  Neck and arm pain  Spinal stenosis  Post-laminectomy syndrome  Herpes zoster (shingles) pain  Pain from compression fractures  Preparation for the injection:  1. Do not eat any solid food or dairy products within 8 hours of your appointment.  2. You may drink clear liquids up to 3 hours before appointment.  Clear liquids include water, black coffee, juice or soda.  No milk or cream please. 3. You may take your regular medication, including pain medications, with a sip of water before your appointment  Diabetics should hold regular insulin (if taken separately) and take 1/2 normal NPH dos the morning of the procedure.  Carry some sugar containing items with you to your appointment. 4. A driver must accompany you and be prepared to drive you home after your procedure.  5. Bring all your current medications with your. 6. An IV may be inserted and sedation may be given at the discretion of the physician.   7. A blood pressure  cuff, EKG and other monitors will often be applied during the procedure.  Some patients may need to have extra oxygen administered for a short period. 8. You will be asked to provide medical information, including your allergies, prior to the procedure.  We must know immediately if you are taking blood thinners (like Coumadin/Warfarin)  Or if you are allergic to IV iodine contrast (dye). We must know if you could possible be pregnant.  Possible side-effects:  Bleeding from needle site  Infection (rare, may require surgery)  Nerve injury (rare)  Numbness & tingling (temporary)  Difficulty urinating (rare, temporary)  Spinal headache ( a headache worse with upright posture)  Light -headedness (temporary)  Pain at injection site (several days)  Decreased blood pressure (temporary)  Weakness in arm/leg (temporary)  Pressure sensation in back/neck (temporary)  Call if you experience:  Fever/chills associated with headache or increased back/neck pain.  Headache worsened by an upright position.  New onset weakness or numbness of an extremity below the injection site  Hives or difficulty breathing (go to the emergency room)  Inflammation or drainage at the infection site  Severe back/neck pain  Any new symptoms which are concerning to you  Please note:  Although the local anesthetic injected can often make your back or neck feel good for several hours after the injection, the pain will likely return.  It takes 3-7 days for steroids to work in the epidural space.  You may not notice any pain relief for at least that one week.    If effective, we will often do a series of three injections spaced 3-6 weeks apart to maximally decrease your pain.  After the initial series, we generally will wait several months before considering a repeat injection of the same type.  If you have any questions, please call [phone] [facility]  Discharge Instructions  General Discharge Instructions :  If you need to reach your doctor call: Monday-Friday 8:00 am - 4:00 pm at [phone] or toll free [phone].  After clinic hours [phone] to have operator reach doctor.  Bring all of your medication bottles to all your appointments in the pain clinic.  To cancel or reschedule your appointment with Pain Management please remember to call 24 hours in advance to avoid a fee.  Refer to the educational materials which you have been given on: General Risks, I had my Procedure. Discharge Instructions, Post Sedation.  Post Procedure Instructions:  The drugs you were given will stay in your system until tomorrow, so for the next 24 hours you should not drive, make any legal decisions or drink any alcoholic beverages.  You may eat anything you prefer, but it is better to start with liquids then soups and crackers, and gradually work up to solid foods.  Please notify your doctor immediately if you have any unusual bleeding, trouble breathing or pain that is not related to your normal pain.  Depending on the type of procedure that was done, some parts of your body may feel week and/or numb.  This usually clears up by tonight or the next day.  Walk with the use of an assistive device or accompanied by an adult for the 24 hours.  You may use ice on the affected area for the first 24 hours.  Put ice in a Ziploc bag and cover with a towel and place against area 15 minutes on 15 minutes off.  You may switch to heat after 24 hours. 

## 2015-05-24 NOTE — Progress Notes (Signed)
Safety precautions to be maintained throughout the outpatient stay will include: orient to surroundings, keep bed in low position, maintain call bell within reach at all times, provide assistance with transfer out of bed and ambulation.  

## 2015-05-24 NOTE — Progress Notes (Signed)
Patient's Name: Claudia Hunter Patient type: Established  MRN: [id number] Service setting: Ambulatory outpatient  DOB: 12-19-1955   DOS: 05/24/2015    Primary Reason(s) for Visit: Interventional Pain Management Treatment. CC: Back Pain   Procedure:  Anesthesia, Analgesia, Anxiolysis:  Type: Therapeutic Inter-Laminar Thoracic Epidural Block Region: Posterior Thoracolumbar Level: T5-6 Laterality: Left Paramedial  Indications: 1. Thoracic radiculitis (Location of Primary Source of Pain) (Left)   2. Thoracic spondylosis with radiculopathy (Left)   3. Bulge of thoracic disc without myelopathy (Left paracentral disc protrusions at T5-6 and T6-7)     Pre-procedure Pain Score: 2/10 Reported level of pain is compatible with clinical observations Post-procedure Pain Score: 0-No pain  Type: Local Anesthesia Local Anesthetic: Lidocaine 1% Route: Infiltration ([place]/IM) IV Access: Declined Sedation: Declined  Indication(s): Analgesia     Pre-Procedure Assessment:  Claudia Hunter is a 60 y.o. year old, female patient, seen today for interventional treatment. She has Complication of diabetes mellitus ([place]); Essential (primary) hypertension; Gout; Adult hypothyroidism; Macroalbuminuric diabetic nephropathy ([place]); Osteoarthritis of knee (Bilateral) (L>R); Pure hypercholesterolemia; Esophagitis, reflux; Diverticulosis of colon; Bulge of thoracic disc without myelopathy (Left paracentral disc protrusions at T5-6 and T6-7); Thoracic radiculitis (Location of Primary Source of Pain) (Left); and Thoracic spondylosis with radiculopathy (Left) on her problem list.. Her primarily concern today is the Back Pain   Pain Type: Chronic pain Pain Location: Back Pain Orientation: Mid, Left Pain Descriptors / Indicators: Aching, Constant Pain Frequency: Intermittent  Date of Last Visit: 05/19/15 Service Provided on Last Visit: Evaluation  Verification of the correct person, correct site (including marking of site),  and correct procedure were performed and confirmed by the patient.  Today's Vitals   05/24/15 0929 05/24/15 1013 05/24/15 1016 05/24/15 1021  BP: 125/75 125/71 125/78 120/89  Pulse:  86 86 84  Temp:      Resp:  '17 14 20  '$ Height:      Weight:      SpO2:  99% 99% 99%  PainSc: 2    0-No pain  PainLoc:      Calculated BMI: Body mass index is 41.34 kg/(m^2). Allergies: She is allergic to asa; celecoxib; clemastine; limonene; and percocet.. Primary Diagnosis: Thoracic radiculitis [M54.14]  Consent: Secured. Under the influence of no sedatives a written informed consent was obtained, after having provided information on the risks and possible complications. To fulfill our ethical and legal obligations, as recommended by the American Medical Association's Code of Ethics, we have provided information to the patient about our clinical impression; the nature and purpose of the treatment or procedure; the risks, benefits, and possible complications of the intervention; alternatives; the risk(s) and benefit(s) of the alternative treatment(s) or procedure(s); and the risk(s) and benefit(s) of doing nothing. The patient was provided information about the risks and possible complications associated with the procedure. These include, but are not limited to, failure to achieve desired goals, infection, bleeding, organ or nerve damage, allergic reactions, paralysis, and death. In the case of spinal procedures these may include, but are not limited to, failure to achieve desired goals, infection, bleeding, organ or nerve damage, allergic reactions, paralysis, and death. In addition, the patient was informed that Medicine is not an exact science; therefore, there is also the possibility of unforeseen risks and possible complications that may result in a catastrophic outcome. The patient indicated having understood very clearly. We have given the patient no guarantees and we have made no promises. Enough time was given  to the patient to ask questions,  all of which were answered to the patient's satisfaction.  Pre-Procedure Preparation: Safety Precautions: Allergies reviewed. Appropriate site, procedure, and patient were confirmed by following the Joint Commission's Universal Protocol (UP.01.01.01), in the form of a "Time Out". The patient was asked to confirm marked site and procedure, before commencing. The patient was asked about blood thinners, or active infections, both of which were denied. Patient was assessed for positional comfort and all pressure points were checked before starting procedure. Monitoring:  As per clinic protocol. Infection Control Precautions: Sterile technique used. Standard Universal Precautions were taken as recommended by the Department of [facility] for Disease Control and Prevention (CDC). Standard pre-surgical skin prep was conducted. Respiratory hygiene and cough etiquette was practiced. Hand hygiene observed. Safe injection practices and needle disposal techniques followed. SDV (single dose vial) medications used. Medications properly checked for expiration dates and contaminants. Personal protective equipment (PPE) used: Surgical mask. Sterile double glove technique. Radiation resistant gloves. Sterile surgical gloves.  Description of Procedure Process:  Time-out: "Time-out" completed before starting procedure, as per protocol. Position: Prone Target Area: For Epidural Steroid injection(s), the target area is the  interlaminar space, initially targeting the lower border of the superior vertebral body lamina. Approach: Interlaminar approach. Area Prepped: Entire Posterior Thoracolumbar Region Prepping solution: Duraprep (Iodine Povacrylex [0.7% available Iodine] and Isopropyl Alcohol, 74% w/w) Safety Precautions: Aspiration looking for blood return was conducted prior to all injections. At no point did we inject any substances, as a needle was being advanced. No attempts  were made at seeking any paresthesias. Safe injection practices and needle disposal techniques used. Medications properly checked for expiration dates. SDV (single dose vial) medications used.   Description of the Procedure: Protocol guidelines were followed. The patient was placed in position over the fluoroscopy table. The target area was identified and the area prepped in the usual manner. Skin & deeper tissues infiltrated with local anesthetic. Appropriate amount of time allowed to pass for local anesthetics to take effect. The procedure needles were then advanced to the target area. The inferior aspect of the superior lamina was contacted and the needle walked caudad, until the lamina was cleared. The epidural space was identified using "loss-of-resistance technique" with 0.9% PF-NSS (2-42mL), in a low friction 10cc LOR glass syringe. Proper needle placement was secured. Negative aspiration confirmed. Solution injected in intermittent fashion, asking for systemic symptoms every 0.5 cc of injectate. The needles were then removed and the area cleansed, making sure to leave some of the prepping solution behind to take advantage of its long term bactericidal properties. EBL: None Materials & Medications Used:  Needle(s) Used: 20g - 10cm, Tuohy-style epidural needle Medication(s): Please see chart orders for medication and dosing details.  Imaging Guidance:   Type of Imaging Technique: Fluoroscopy Guidance (Spinal) Indication(s): Assistance in needle guidance and placement for procedures requiring needle placement in or near specific anatomical locations not easily accessible without such assistance. Exposure Time: Please see nurses notes. Contrast: Before injecting any contrast, we confirmed that the patient did not have an allergy to iodine, shellfish, or radiological contrast. Once satisfactory needle placement was completed at the desired level, radiological contrast was injected. Injection was  conducted under continuous fluoroscopic guidance. Injection of contrast accomplished without complications. See chart for type and volume of contrast used. Fluoroscopic Guidance: I was personally present in the fluoroscopy suite, where the patient was placed in position for the procedure, over the fluoroscopy-compatible table. Fluoroscopy was manipulated, using "Tunnel Vision Technique", to obtain the best  possible view of the target area, on the affected side. Parallax error was corrected before commencing the procedure. A "direction-depth-direction" technique was used to introduce the needle under continuous pulsed fluoroscopic guidance. Once the target was reached, antero-posterior, oblique, and lateral fluoroscopic projection views were taken to confirm needle placement in all planes. Permanently recorded images stored by scanning into EMR. Interpretation: Intraoperative imaging interpretation by performing Physician. Adequate needle placement confirmed. Appropriate spread of contrast to desired area. No evidence of afferent or efferent intravascular uptake. No intrathecal or subarachnoid spread observed. Permanent hardcopy images in multiple planes scanned into the patient's record.  Antibiotic Prophylaxis:  Indication(s): No indications identified. Type:  Antibiotics Given (last 72 hours)    None       Post-operative Assessment:   Complications: No immediate post-treatment complications were observed. Disposition: Return to clinic for follow-up evaluation. The patient tolerated the entire procedure well. A repeat set of vitals were taken after the procedure and the patient was kept under observation following institutional policy, for this type of procedure. Post-procedural neurological assessment was performed, showing return to baseline, prior to discharge. The patient was discharged home, once institutional criteria were met. The patient was provided with post-procedure discharge instructions,  including a section on how to identify potential problems. Should any problems arise concerning this procedure, the patient was given instructions to immediately contact us, at any time, without hesitation. In any case, we plan to contact the patient by telephone for a follow-up status report regarding this interventional procedure. Comments:  No additional relevant information.  Medications administered during this visit: We administered lidocaine (PF), ropivacaine (PF) 2 mg/ml (0.2%), dexamethasone, sodium chloride, and iopamidol.  Prescriptions ordered during this visit: New Prescriptions   No medications on file    Future Appointments Date Time Provider [place]  06/07/2015 9:45 AM Milinda Pointer, MD [facility] None    Primary Care Physician: Rusty Aus, MD Location: [facility] Note by: Kathlen Brunswick. Dossie Arbour, M.D, DABA, DABAPM, DABPM, DABIPP, FIPP  Disclaimer:  Medicine is not an exact science. The only guarantee in medicine is that nothing is guaranteed. It is important to note that the decision to proceed with this intervention was based on the information collected from the patient. The Data and conclusions were drawn from the patient's questionnaire, the interview, and the physical examination. Because the information was provided in large part by the patient, it cannot be guaranteed that it has not been purposely or unconsciously manipulated. Every effort has been made to obtain as much relevant data as possible for this evaluation. It is important to note that the conclusions that lead to this procedure are derived in large part from the available data. Always take into account that the treatment will also be dependent on availability of resources and existing treatment guidelines, considered by other Pain Management Practitioners as being common knowledge and practice, at the time of the intervention. For Medico-Legal purposes, it is also  important to point out that variation in procedural techniques and pharmacological choices are the acceptable norm. The indications, contraindications, technique, and results of the above procedure should only be interpreted and judged by a Board-Certified Interventional Pain Specialist with extensive familiarity and expertise in the same exact procedure and technique. Attempts at providing opinions without similar or greater experience and expertise than that of the treating physician will be considered as inappropriate and unethical, and shall result in a formal complaint to the state medical board and applicable specialty societies.

## 2015-05-25 ENCOUNTER — Telehealth: Payer: Self-pay | Admitting: *Deleted

## 2015-05-25 NOTE — Telephone Encounter (Signed)
No problems post procedure. 

## 2015-06-07 ENCOUNTER — Ambulatory Visit: Payer: 59 | Attending: Pain Medicine | Admitting: Pain Medicine

## 2015-06-07 ENCOUNTER — Encounter: Payer: Self-pay | Admitting: Pain Medicine

## 2015-06-07 VITALS — BP 158/103 | HR 83 | Temp 98.3°F | Resp 16 | Ht 66.0 in | Wt 256.0 lb

## 2015-06-07 DIAGNOSIS — E039 Hypothyroidism, unspecified: Secondary | ICD-10-CM | POA: Insufficient documentation

## 2015-06-07 DIAGNOSIS — M5124 Other intervertebral disc displacement, thoracic region: Secondary | ICD-10-CM | POA: Insufficient documentation

## 2015-06-07 DIAGNOSIS — M109 Gout, unspecified: Secondary | ICD-10-CM | POA: Insufficient documentation

## 2015-06-07 DIAGNOSIS — M5414 Radiculopathy, thoracic region: Secondary | ICD-10-CM

## 2015-06-07 DIAGNOSIS — G8929 Other chronic pain: Secondary | ICD-10-CM | POA: Diagnosis not present

## 2015-06-07 DIAGNOSIS — E1121 Type 2 diabetes mellitus with diabetic nephropathy: Secondary | ICD-10-CM | POA: Insufficient documentation

## 2015-06-07 DIAGNOSIS — K210 Gastro-esophageal reflux disease with esophagitis: Secondary | ICD-10-CM | POA: Diagnosis not present

## 2015-06-07 DIAGNOSIS — M170 Bilateral primary osteoarthritis of knee: Secondary | ICD-10-CM | POA: Insufficient documentation

## 2015-06-07 DIAGNOSIS — M47894 Other spondylosis, thoracic region: Secondary | ICD-10-CM | POA: Insufficient documentation

## 2015-06-07 DIAGNOSIS — I10 Essential (primary) hypertension: Secondary | ICD-10-CM | POA: Diagnosis not present

## 2015-06-07 DIAGNOSIS — E7800 Pure hypercholesterolemia, unspecified: Secondary | ICD-10-CM | POA: Diagnosis not present

## 2015-06-07 DIAGNOSIS — K5730 Diverticulosis of large intestine without perforation or abscess without bleeding: Secondary | ICD-10-CM | POA: Insufficient documentation

## 2015-06-07 DIAGNOSIS — M549 Dorsalgia, unspecified: Secondary | ICD-10-CM | POA: Diagnosis present

## 2015-06-07 DIAGNOSIS — M4724 Other spondylosis with radiculopathy, thoracic region: Secondary | ICD-10-CM | POA: Diagnosis not present

## 2015-06-07 MED ORDER — IOPAMIDOL (ISOVUE-M 200) INJECTION 41%
10.0000 mL | Freq: Once | INTRAMUSCULAR | Status: AC
  Administered 2015-06-07: 5 mL via EPIDURAL

## 2015-06-07 MED ORDER — DEXAMETHASONE SODIUM PHOSPHATE 10 MG/ML IJ SOLN: 10.0000 mg | Freq: Once | INTRAMUSCULAR | Status: DC

## 2015-06-07 MED ORDER — ROPIVACAINE HCL 2 MG/ML IJ SOLN
2.0000 mL | Freq: Once | INTRAMUSCULAR | Status: AC
  Administered 2015-06-07: 2 mL via EPIDURAL

## 2015-06-07 MED ORDER — SODIUM CHLORIDE 0.9% FLUSH
2.0000 mL | Freq: Once | INTRAVENOUS | Status: AC
  Administered 2015-06-07: 2 mL

## 2015-06-07 MED ORDER — LIDOCAINE HCL (PF) 1 % IJ SOLN
10.0000 mL | Freq: Once | INTRAMUSCULAR | Status: AC
  Administered 2015-06-07: 10 mL

## 2015-06-07 NOTE — Patient Instructions (Signed)
Epidural Steroid Injection Patient Information  Description: The epidural space surrounds the nerves as they exit the spinal cord.  In some patients, the nerves can be compressed and inflamed by a bulging disc or a tight spinal canal (spinal stenosis).  By injecting steroids into the epidural space, we can bring irritated nerves into direct contact with a potentially helpful medication.  These steroids act directly on the irritated nerves and can reduce swelling and inflammation which often leads to decreased pain.  Epidural steroids may be injected anywhere along the spine and from the neck to the low back depending upon the location of your pain.   After numbing the skin with local anesthetic (like Novocaine), a small needle is passed into the epidural space slowly.  You may experience a sensation of pressure while this is being done.  The entire block usually last less than 10 minutes.  Conditions which may be treated by epidural steroids:   Low back and leg pain  Neck and arm pain  Spinal stenosis  Post-laminectomy syndrome  Herpes zoster (shingles) pain  Pain from compression fractures  Preparation for the injection:  1. Do not eat any solid food or dairy products within 8 hours of your appointment.  2. You may drink clear liquids up to 3 hours before appointment.  Clear liquids include water, black coffee, juice or soda.  No milk or cream please. 3. You may take your regular medication, including pain medications, with a sip of water before your appointment  Diabetics should hold regular insulin (if taken separately) and take 1/2 normal NPH dos the morning of the procedure.  Carry some sugar containing items with you to your appointment. 4. A driver must accompany you and be prepared to drive you home after your procedure.  5. Bring all your current medications with your. 6. An IV may be inserted and sedation may be given at the discretion of the physician.   7. A blood pressure  cuff, EKG and other monitors will often be applied during the procedure.  Some patients may need to have extra oxygen administered for a short period. 8. You will be asked to provide medical information, including your allergies, prior to the procedure.  We must know immediately if you are taking blood thinners (like Coumadin/Warfarin)  Or if you are allergic to IV iodine contrast (dye). We must know if you could possible be pregnant.  Possible side-effects:  Bleeding from needle site  Infection (rare, may require surgery)  Nerve injury (rare)  Numbness & tingling (temporary)  Difficulty urinating (rare, temporary)  Spinal headache ( a headache worse with upright posture)  Light -headedness (temporary)  Pain at injection site (several days)  Decreased blood pressure (temporary)  Weakness in arm/leg (temporary)  Pressure sensation in back/neck (temporary)  Call if you experience:  Fever/chills associated with headache or increased back/neck pain.  Headache worsened by an upright position.  New onset weakness or numbness of an extremity below the injection site  Hives or difficulty breathing (go to the emergency room)  Inflammation or drainage at the infection site  Severe back/neck pain  Any new symptoms which are concerning to you  Please note:  Although the local anesthetic injected can often make your back or neck feel good for several hours after the injection, the pain will likely return.  It takes 3-7 days for steroids to work in the epidural space.  You may not notice any pain relief for at least that one week.    If effective, we will often do a series of three injections spaced 3-6 weeks apart to maximally decrease your pain.  After the initial series, we generally will wait several months before considering a repeat injection of the same type.  If you have any questions, please call [phone] [facility]  Discharge Instructions  General Discharge Instructions :  If you need to reach your doctor call: Monday-Friday 8:00 am - 4:00 pm at [phone] or toll free [phone].  After clinic hours [phone] to have operator reach doctor.  Bring all of your medication bottles to all your appointments in the pain clinic.  To cancel or reschedule your appointment with Pain Management please remember to call 24 hours in advance to avoid a fee.  Refer to the educational materials which you have been given on: General Risks, I had my Procedure. Discharge Instructions, Post Sedation.  Post Procedure Instructions:  The drugs you were given will stay in your system until tomorrow, so for the next 24 hours you should not drive, make any legal decisions or drink any alcoholic beverages.  You may eat anything you prefer, but it is better to start with liquids then soups and crackers, and gradually work up to solid foods.  Please notify your doctor immediately if you have any unusual bleeding, trouble breathing or pain that is not related to your normal pain.  Depending on the type of procedure that was done, some parts of your body may feel week and/or numb.  This usually clears up by tonight or the next day.  Walk with the use of an assistive device or accompanied by an adult for the 24 hours.  You may use ice on the affected area for the first 24 hours.  Put ice in a Ziploc bag and cover with a towel and place against area 15 minutes on 15 minutes off.  You may switch to heat after 24 hours. 

## 2015-06-07 NOTE — Progress Notes (Signed)
Safety precautions to be maintained throughout the outpatient stay will include: orient to surroundings, keep bed in low position, maintain call bell within reach at all times, provide assistance with transfer out of bed and ambulation.  

## 2015-06-07 NOTE — Progress Notes (Signed)
Patient's Name: Claudia Hunter  Patient type: Established  MRN: [id number]  Service setting: Ambulatory outpatient  DOB: 07/20/1955  Location: [facility]  DOS: 06/07/2015  Primary Care Physician: Rusty Aus, MD  Note by: Kathlen Brunswick. Dossie Arbour, M.D, DABA, DABAPM, DABPM, DABIPP, FIPP  Referring Physician: Rusty Aus, MD  Specialty: Board-Certified Interventional Pain Management     Primary Reason(s) for Visit: Interventional Pain Management Treatment. CC: Back Pain  Primary Diagnosis: Thoracic radiculitis [M54.14]   Procedure:  Anesthesia, Analgesia, Anxiolysis:  Type: Therapeutic Inter-Laminar Thoracic Epidural Block Region: Posterior Thoracolumbar Level: T5-6 Laterality: Left Paramedial  Indications: 1. Thoracic radiculitis (Location of Primary Source of Pain) (Left)   2. Thoracic spondylosis with radiculopathy (Left)     Pre-procedure Pain Score: 3/10 Reported level of pain is compatible with clinical observations Post-procedure Pain Score: 3   Type: Local Anesthesia Local Anesthetic: Lidocaine 1% Route: Infiltration ([place]/IM) IV Access: Declined Sedation: Declined  Indication(s): Analgesia     Pre-Procedure Assessment:  Claudia Hunter is a 60 y.o. year old, female patient, seen today for interventional treatment. She has Complication of diabetes mellitus ([place]); Essential (primary) hypertension; Gout; Adult hypothyroidism; Macroalbuminuric diabetic nephropathy ([place]); Osteoarthritis of knee (Bilateral) (L>R); Pure hypercholesterolemia; Esophagitis, reflux; Diverticulosis of colon; Bulge of thoracic disc without myelopathy (Left paracentral disc protrusions at T5-6 and T6-7); Thoracic radiculitis (Location of Primary Source of Pain) (Left); and Thoracic spondylosis with radiculopathy (Left) on her problem list.. Her primarily concern today is the Back Pain   Pain Type: Chronic pain Pain Location: Back Pain Orientation: Lower, Mid Pain Descriptors /  Indicators: Aching, Constant Pain Frequency: Constant  Date of Last Visit: 05/24/15 Service Provided on Last Visit: Procedure (TESI)  Verification of the correct person, correct site (including marking of site), and correct procedure were performed and confirmed by the patient.  Consent: Secured. Under the influence of no sedatives a written informed consent was obtained, after having provided information on the risks and possible complications. To fulfill our ethical and legal obligations, as recommended by the American Medical Association's Code of Ethics, we have provided information to the patient about our clinical impression; the nature and purpose of the treatment or procedure; the risks, benefits, and possible complications of the intervention; alternatives; the risk(s) and benefit(s) of the alternative treatment(s) or procedure(s); and the risk(s) and benefit(s) of doing nothing. The patient was provided information about the risks and possible complications associated with the procedure. These include, but are not limited to, failure to achieve desired goals, infection, bleeding, organ or nerve damage, allergic reactions, paralysis, and death. In the case of spinal procedures these may include, but are not limited to, failure to achieve desired goals, infection, bleeding, organ or nerve damage, allergic reactions, paralysis, and death. In addition, the patient was informed that Medicine is not an exact science; therefore, there is also the possibility of unforeseen risks and possible complications that may result in a catastrophic outcome. The patient indicated having understood very clearly. We have given the patient no guarantees and we have made no promises. Enough time was given to the patient to ask questions, all of which were answered to the patient's satisfaction.  Consent Attestation: I, the ordering provider, attest that I have discussed with the patient the benefits, risks,  side-effects, alternatives, likelihood of achieving goals, and potential problems during recovery for the procedure that I have provided informed consent.  Pre-Procedure Preparation: Safety Precautions: Allergies reviewed. Appropriate site, procedure, and patient were confirmed by  following the Joint Commission's Universal Protocol (UP.01.01.01), in the form of a "Time Out". The patient was asked to confirm marked site and procedure, before commencing. The patient was asked about blood thinners, or active infections, both of which were denied. Patient was assessed for positional comfort and all pressure points were checked before starting procedure. Allergies: She is allergic to asa; celecoxib; clemastine; limonene; and percocet.. Infection Control Precautions: Sterile technique used. Standard Universal Precautions were taken as recommended by the Department of [facility] for Disease Control and Prevention (CDC). Standard pre-surgical skin prep was conducted. Respiratory hygiene and cough etiquette was practiced. Hand hygiene observed. Safe injection practices and needle disposal techniques followed. SDV (single dose vial) medications used. Medications properly checked for expiration dates and contaminants. Personal protective equipment (PPE) used: Surgical mask. Sterile Radiation-resistant gloves. Monitoring:  As per clinic protocol. Filed Vitals:   06/07/15 1002 06/07/15 1007 06/07/15 1012 06/07/15 1016  BP: 151/82 147/86 152/80 158/103  Pulse: 85 84 81 83  Temp:      Resp: '14 12 15 16  '$ Height:      Weight:      SpO2: 98% 97% 97% 98%  Calculated BMI: Body mass index is 41.34 kg/(m^2).  Description of Procedure Process:  Time-out: "Time-out" completed before starting procedure, as per protocol. Position: Prone Target Area: For Epidural Steroid injection(s), the target area is the  interlaminar space, initially targeting the lower border of the superior vertebral body  lamina. Approach: Interlaminar approach. Area Prepped: Entire Posterior Thoracolumbar Region Prepping solution: Duraprep (Iodine Povacrylex [0.7% available Iodine] and Isopropyl Alcohol, 74% w/w) Safety Precautions: Aspiration looking for blood return was conducted prior to all injections. At no point did we inject any substances, as a needle was being advanced. No attempts were made at seeking any paresthesias. Safe injection practices and needle disposal techniques used. Medications properly checked for expiration dates. SDV (single dose vial) medications used.   Description of the Procedure: Protocol guidelines were followed. The patient was placed in position over the fluoroscopy table. The target area was identified and the area prepped in the usual manner. Skin & deeper tissues infiltrated with local anesthetic. Appropriate amount of time allowed to pass for local anesthetics to take effect. The procedure needles were then advanced to the target area. The inferior aspect of the superior lamina was contacted and the needle walked caudad, until the lamina was cleared. The epidural space was identified using "loss-of-resistance technique" with 0.9% PF-NSS (2-80m), in a low friction 10cc LOR glass syringe. Proper needle placement was secured. Negative aspiration confirmed. Solution injected in intermittent fashion, asking for systemic symptoms every 0.5 cc of injectate. The needles were then removed and the area cleansed, making sure to leave some of the prepping solution behind to take advantage of its long term bactericidal properties. EBL: None Materials & Medications Used:  Needle(s) Used: 20g - 10cm, Tuohy-style epidural needle Medication(s): Please see chart orders for medication and dosing details.  Imaging Guidance:   Type of Imaging Technique: Fluoroscopy Guidance (Spinal) Indication(s): Assistance in needle guidance and placement for procedures requiring needle placement in or near specific  anatomical locations not easily accessible without such assistance. Exposure Time: Please see nurses notes. Contrast: Before injecting any contrast, we confirmed that the patient did not have an allergy to iodine, shellfish, or radiological contrast. Once satisfactory needle placement was completed at the desired level, radiological contrast was injected. Injection was conducted under continuous fluoroscopic guidance. Injection of contrast accomplished without complications. See  chart for type and volume of contrast used. Fluoroscopic Guidance: I was personally present in the fluoroscopy suite, where the patient was placed in position for the procedure, over the fluoroscopy-compatible table. Fluoroscopy was manipulated, using "Tunnel Vision Technique", to obtain the best possible view of the target area, on the affected side. Parallax error was corrected before commencing the procedure. A "direction-depth-direction" technique was used to introduce the needle under continuous pulsed fluoroscopic guidance. Once the target was reached, antero-posterior, oblique, and lateral fluoroscopic projection views were taken to confirm needle placement in all planes. Permanently recorded images stored by scanning into EMR. Interpretation: Intraoperative imaging interpretation by performing Physician. Adequate needle placement confirmed. Adequate needle placement confirmed in AP, lateral, & Oblique Views. Appropriate spread of contrast to desired area. No evidence of afferent or efferent intravascular uptake. No intrathecal or subarachnoid spread observed. Spread is WNL. No evidence of epidural restrictions. Permanent hardcopy images in multiple planes scanned into the patient's record.  Antibiotic Prophylaxis:  Indication(s): No indications identified. Type:  Antibiotics Given (last 72 hours)    None       Post-operative Assessment:   Complications: No immediate post-treatment complications were  observed. Disposition: Return to clinic for follow-up evaluation. The patient tolerated the entire procedure well. A repeat set of vitals were taken after the procedure and the patient was kept under observation following institutional policy, for this type of procedure. Post-procedural neurological assessment was performed, showing return to baseline, prior to discharge. The patient was discharged home, once institutional criteria were met. The patient was provided with post-procedure discharge instructions, including a section on how to identify potential problems. Should any problems arise concerning this procedure, the patient was given instructions to immediately contact us, at any time, without hesitation. In any case, we plan to contact the patient by telephone for a follow-up status report regarding this interventional procedure. Comments:  No additional relevant information.  Medications administered during this visit: We administered iopamidol, lidocaine (PF), sodium chloride flush, and ropivacaine (PF) 2 mg/ml (0.2%).  Prescriptions ordered during this visit: New Prescriptions   No medications on file    Future Appointments Date Time Provider [place]  06/22/2015 8:00 AM Milinda Pointer, MD [facility] None    Primary Care Physician: Rusty Aus, MD Location: [facility] Note by: Kathlen Brunswick. Dossie Arbour, M.D, DABA, DABAPM, DABPM, DABIPP, FIPP  Disclaimer:  Medicine is not an exact science. The only guarantee in medicine is that nothing is guaranteed. It is important to note that the decision to proceed with this intervention was based on the information collected from the patient. The Data and conclusions were drawn from the patient's questionnaire, the interview, and the physical examination. Because the information was provided in large part by the patient, it cannot be guaranteed that it has not been purposely or unconsciously manipulated. Every  effort has been made to obtain as much relevant data as possible for this evaluation. It is important to note that the conclusions that lead to this procedure are derived in large part from the available data. Always take into account that the treatment will also be dependent on availability of resources and existing treatment guidelines, considered by other Pain Management Practitioners as being common knowledge and practice, at the time of the intervention. For Medico-Legal purposes, it is also important to point out that variation in procedural techniques and pharmacological choices are the acceptable norm. The indications, contraindications, technique, and results of the above procedure should only be interpreted  and judged by a Board-Certified Interventional Pain Specialist with extensive familiarity and expertise in the same exact procedure and technique. Attempts at providing opinions without similar or greater experience and expertise than that of the treating physician will be considered as inappropriate and unethical, and shall result in a formal complaint to the state medical board and applicable specialty societies.

## 2015-06-08 ENCOUNTER — Telehealth: Payer: Self-pay | Admitting: *Deleted

## 2015-06-08 NOTE — Telephone Encounter (Signed)
Spoke with patient, denies any questions or concerns from procedure on yesterday.

## 2015-06-22 ENCOUNTER — Ambulatory Visit: Payer: 59 | Admitting: Pain Medicine

## 2015-06-29 ENCOUNTER — Ambulatory Visit: Payer: 59 | Attending: Pain Medicine | Admitting: Pain Medicine

## 2015-06-29 ENCOUNTER — Encounter: Payer: Self-pay | Admitting: Pain Medicine

## 2015-06-29 VITALS — BP 146/97 | HR 88 | Temp 98.8°F | Resp 18 | Ht 66.0 in | Wt 256.0 lb

## 2015-06-29 DIAGNOSIS — E039 Hypothyroidism, unspecified: Secondary | ICD-10-CM | POA: Insufficient documentation

## 2015-06-29 DIAGNOSIS — M109 Gout, unspecified: Secondary | ICD-10-CM | POA: Diagnosis not present

## 2015-06-29 DIAGNOSIS — Z6841 Body Mass Index (BMI) 40.0 and over, adult: Secondary | ICD-10-CM | POA: Diagnosis not present

## 2015-06-29 DIAGNOSIS — K5730 Diverticulosis of large intestine without perforation or abscess without bleeding: Secondary | ICD-10-CM | POA: Insufficient documentation

## 2015-06-29 DIAGNOSIS — M4724 Other spondylosis with radiculopathy, thoracic region: Secondary | ICD-10-CM | POA: Insufficient documentation

## 2015-06-29 DIAGNOSIS — M5134 Other intervertebral disc degeneration, thoracic region: Secondary | ICD-10-CM

## 2015-06-29 DIAGNOSIS — M549 Dorsalgia, unspecified: Secondary | ICD-10-CM | POA: Diagnosis present

## 2015-06-29 DIAGNOSIS — M5414 Radiculopathy, thoracic region: Secondary | ICD-10-CM

## 2015-06-29 DIAGNOSIS — M5124 Other intervertebral disc displacement, thoracic region: Secondary | ICD-10-CM | POA: Insufficient documentation

## 2015-06-29 DIAGNOSIS — G8929 Other chronic pain: Secondary | ICD-10-CM | POA: Insufficient documentation

## 2015-06-29 DIAGNOSIS — E1140 Type 2 diabetes mellitus with diabetic neuropathy, unspecified: Secondary | ICD-10-CM | POA: Insufficient documentation

## 2015-06-29 DIAGNOSIS — K219 Gastro-esophageal reflux disease without esophagitis: Secondary | ICD-10-CM | POA: Diagnosis not present

## 2015-06-29 DIAGNOSIS — E7800 Pure hypercholesterolemia, unspecified: Secondary | ICD-10-CM | POA: Insufficient documentation

## 2015-06-29 DIAGNOSIS — E669 Obesity, unspecified: Secondary | ICD-10-CM | POA: Diagnosis not present

## 2015-06-29 DIAGNOSIS — M170 Bilateral primary osteoarthritis of knee: Secondary | ICD-10-CM | POA: Diagnosis not present

## 2015-06-29 DIAGNOSIS — I10 Essential (primary) hypertension: Secondary | ICD-10-CM | POA: Insufficient documentation

## 2015-06-29 NOTE — Progress Notes (Signed)
Patient's Name: Claudia Hunter  Patient type: Established  MRN: [id number]  Service setting: Ambulatory outpatient  DOB: [birth year]  Location: [facility]  DOS: 06/29/2015  Primary Care Physician: Rusty Aus, MD  Note by: Kathlen Brunswick. Dossie Arbour, M.D, DABA, DABAPM, DABPM, DABIPP, FIPP  Referring Physician: Rusty Aus, MD  Specialty: Board-Certified Interventional Pain Management  Last Visit to Pain Management: 06/08/2015   Primary Reason(s) for Visit: Encounter for post-procedure evaluation of chronic illness with mild to moderate exacerbation CC: Back Pain   HPI  Claudia Hunter is a 60 y.o. year old, female patient, who returns today as an established patient. She has Complication of diabetes mellitus ([place]); Essential (primary) hypertension; Gout; Adult hypothyroidism; Macroalbuminuric diabetic nephropathy ([place]); Osteoarthritis of knee (Bilateral) (L>R); Pure hypercholesterolemia; Esophagitis, reflux; Diverticulosis of colon; Bulge of thoracic disc without myelopathy (Left paracentral disc protrusions at T5-6 and T6-7); Thoracic radiculitis (Location of Primary Source of Pain) (Left); and Thoracic spondylosis with radiculopathy (Left) on her problem list.. Her primarily concern today is the Back Pain   Pain Assessment: Self-Reported Pain Score: 0-No pain Reported level is compatible with observation Pain Type: Chronic pain Pain Location: Back Pain Descriptors / Indicators: Heaviness Pain Frequency: Intermittent  The patient comes into the clinics today for post-procedure evaluation on the interventional treatment done on 06/07/2015.The patient returns to the clinics today after having completed a left T5-6 thoracic epidural steroid injection #3.The patient is very happy and she is currently having no pain. We will see her back on a PRN basis.  Date of Last Visit: 06/07/15 Service Provided on Last Visit: Procedure  Post-Procedure Assessment  Procedure done on last  visit: On 06/07/2015 the patient underwent her third left T5-6 thoracic epidural steroid injection under fluoroscopic guidance, no sedation. Side-effects or Adverse reactions: None reported Sedation: No sedation used  Results: Ultra-Short Term Relief (First 1 hour after procedure): 100 %  Analgesia during this period is likely to be Local Anesthetic and/or IV Sedative (Analgesic/Anxiolitic) related Short Term Relief (Initial 4-6 hrs after procedure): 100 % Complete relief confirms area to be the source of pain Long Term Relief : 95 % Long-term benefit would suggest an inflammatory etiology to the pain   Current Relief (Now): 100%  Persistent relief would suggest effective anti-inflammatory effects from steroids Interpretation of Results: This would suggest that this type of therapy would be a very effective palliative treatment for this type of pain, should it return.  Laboratory Chemistry  Inflammation Markers No results found for: ESRSEDRATE, CRP  Renal Function Lab Results  Component Value Date   BUN 20 02/07/2009   CREATININE 1.30* 04/14/2015   GFRAA  02/07/2009    >60        The eGFR has been calculated using the MDRD equation. This calculation has not been validated in all clinical situations. eGFR's persistently <60 mL/min signify possible Chronic Kidney Disease.   GFRNONAA 50* 02/07/2009    Hepatic Function Lab Results  Component Value Date   AST 17 12/17/2008   ALT 12 12/17/2008   ALBUMIN 3.4* 12/17/2008    Electrolytes Lab Results  Component Value Date   NA 137 02/07/2009   K 4.6 02/07/2009   CL 103 02/07/2009   CALCIUM 9.3 02/07/2009   MG 1.7 12/17/2008    Pain Modulating Vitamins No results found for: [place], VD125OH2TOT, QM0867YP9, [id number], VITAMINB12  Coagulation Parameters Lab Results  Component Value Date   INR 1.06 12/15/2008   LABPROT 13.7 12/15/2008  Note: I personally reviewed the above data. Results made available to  patient.  Recent Diagnostic Imaging  Ct Angio Chest Pe W/cm &/or Wo Cm  04/14/2015  CLINICAL DATA:  Left-sided chest pain for 2 weeks EXAM: CT ANGIOGRAPHY CHEST WITH CONTRAST TECHNIQUE: Multidetector CT imaging of the chest was performed using the standard protocol during bolus administration of intravenous contrast. Multiplanar CT image reconstructions and MIPs were obtained to evaluate the vascular anatomy. CONTRAST:  143m OMNIPAQUE IOHEXOL 350 MG/ML SOLN COMPARISON:  Dictated report from chest x-ray of 04/11/2015 FINDINGS: The pulmonary arteries opacify well. There is no evidence of acute pulmonary embolism. The thoracic aorta also opacifies with no significant abnormality noted. And anterior mediastinal lymph node is present on image 64 with short axis diameter of 10 mm. No other mediastinal or hilar nodes are evident. The heart is mildly enlarged. No pericardial effusion is seen. The liver appears somewhat prominent on images through the upper abdomen. The thyroid gland is unremarkable. On lung window images, no lung parenchymal abnormality is seen. No pleural effusion is noted. There are degenerative changes throughout the mid to lower thoracic spine. Review of the MIP images confirms the above findings. IMPRESSION: 1. No evidence of acute pulmonary embolism. 2. No infiltrate or effusion is seen. 3. Mild cardiomegaly. Electronically Signed   By: PIvar DrapeM.D.   On: 04/14/2015 10:47    Meds  The patient has a current medication list which includes the following prescription(s): allopurinol, atenolol, biotin, cholecalciferol, cyanocobalamin, cyclobenzaprine, ferrous gluconate, gabapentin, iron-vitamin c, losartan-hydrochlorothiazide, metformin, naproxen, pantoprazole, tramadol, zolpidem, and hydrocodone-acetaminophen.  Current Outpatient Prescriptions on File Prior to Visit  Medication Sig  . allopurinol (ZYLOPRIM) 300 MG tablet Take 300 mg by mouth daily.  .Marland Kitchenatenolol (TENORMIN) 50 MG tablet Take  50 mg by mouth daily.  .Marland KitchenBIOTIN PO Take 1 tablet by mouth daily.  . Cholecalciferol (VITAMIN D-1000 MAX ST) 1000 units tablet Take by mouth.  . Cyanocobalamin (RA VITAMIN B-12 TR) 1000 MCG TBCR Take by mouth. Reported on 05/24/2015  . cyclobenzaprine (FLEXERIL) 10 MG tablet take 1 tablet by mouth twice a day for MUSCLES  . Ferrous Gluconate (FERGON PO) Take 2 tablets by mouth daily.  .Marland Kitchengabapentin (NEURONTIN) 300 MG capsule take 1 capsule by mouth three times a day  . Iron-Vitamin C (VITRON-C) 65-125 MG TABS Take 1 tablet by mouth daily.  .Marland Kitchenlosartan-hydrochlorothiazide (HYZAAR) 100-25 MG tablet Take by mouth.  . metFORMIN (GLUCOPHAGE) 500 MG tablet Take 500 mg by mouth 2 (two) times daily with a meal.  . naproxen (NAPROSYN) 500 MG tablet Take 1 tablet (500 mg total) by mouth 2 (two) times daily.  . pantoprazole (PROTONIX) 40 MG tablet Take 40 mg by mouth daily.  . traMADol (ULTRAM) 50 MG tablet Take 50 mg by mouth every 6 (six) hours as needed for moderate pain.  .Marland Kitchenzolpidem (AMBIEN) 10 MG tablet Take by mouth.  .Marland KitchenHYDROcodone-acetaminophen (NORCO/VICODIN) 5-325 MG per tablet Take 1 tablet by mouth every 6 (six) hours as needed for moderate pain. Reported on 06/29/2015   No current facility-administered medications on file prior to visit.    ROS  Constitutional: Denies any fever or chills Gastrointestinal: No reported hemesis, hematochezia, vomiting, or acute GI distress Musculoskeletal: Denies any acute onset joint swelling, redness, loss of ROM, or weakness Neurological: No reported episodes of acute onset apraxia, aphasia, dysarthria, agnosia, amnesia, paralysis, loss of coordination, or loss of consciousness  Allergies  Ms. HSetois allergic to asa; celecoxib;  clemastine; limonene; and percocet.  [place]  Medical:  Ms. Wisor  has a past medical history of Hypertension; Diabetes mellitus without complication ([place]); Gout; Neuropathy ([place]); and Diverticulitis. Family: family history  includes Cancer in her mother; Heart disease in her father. Surgical:  has past surgical history that includes Abdominal hysterectomy; Cholecystectomy; and Cesarean section. Tobacco:  reports that she has never smoked. She does not have any smokeless tobacco history on file. Alcohol:  reports that she does not drink alcohol. Drug:  has no drug history on file.  Constitutional Exam  Vitals: Blood pressure 146/97, pulse 88, temperature 98.8 F (37.1 C), temperature source Oral, resp. rate 18, height '5\' 6"'$  (1.676 m), weight 256 lb (116.121 kg), SpO2 100 %. General appearance: Well nourished, well developed, and well hydrated. In no acute distress Calculated BMI/Body habitus: Body mass index is 41.34 kg/(m^2). (>40 kg/m2) Extreme obesity (Class III) - 254% higher incidence of chronic pain Psych/Mental status: Alert and oriented x 3 (person, place, & time) Eyes: PERLA Respiratory: No evidence of acute respiratory distress  Cervical Spine Exam  Inspection: No masses, redness, or swelling Alignment: Symmetrical ROM: Functional: ROM is within functional limits [facility]) Stability: No instability detected Muscle strength & Tone: Functionally intact Sensory: Unimpaired Palpation: No complaints of tenderness  Upper Extremity (UE) Exam    Side: Right upper extremity  Side: Left upper extremity  Inspection: No masses, redness, swelling, or asymmetry  Inspection: No masses, redness, swelling, or asymmetry  ROM:  ROM:  Functional: ROM is within functional limits [facility])  Functional: ROM is within functional limits [facility])  Muscle strength & Tone: Functionally intact  Muscle strength & Tone: Functionally intact  Sensory: Unimpaired  Sensory: Unimpaired  Palpation: Non-contributory  Palpation: Non-contributory   Thoracic Spine Exam  Inspection: No masses, redness, or swelling Alignment: Symmetrical ROM: Functional: ROM is within functional limits [facility]) Stability: No instability detected Sensory:  Unimpaired Muscle strength & Tone: Functionally intact Palpation: No complaints of tenderness  Lumbar Spine Exam  Inspection: No masses, redness, or swelling Alignment: Symmetrical ROM: Functional: ROM is within functional limits [facility]) Stability: No instability detected Muscle strength & Tone: Functionally intact Sensory: Unimpaired Palpation: No complaints of tenderness Provocative Tests: Lumbar Hyperextension and rotation test: deferred Patrick's Maneuver: deferred  Gait & Posture Assessment  Ambulation: Unassisted Gait: Unaffected Posture: WNL  Lower Extremity Exam    Side: Right lower extremity  Side: Left lower extremity  Inspection: No masses, redness, swelling, or asymmetry ROM:  Inspection: No masses, redness, swelling, or asymmetry ROM:  Functional: ROM is within functional limits [facility])  Functional: ROM is within functional limits [facility])  Muscle strength & Tone: Functionally intact  Muscle strength & Tone: Functionally intact  Sensory: Unimpaired  Sensory: Unimpaired  Palpation: Non-contributory  Palpation: Non-contributory   Assessment & Plan  Primary Diagnosis & Pertinent Problem List: The primary encounter diagnosis was Thoracic radiculitis (Location of Primary Source of Pain) (Left). Diagnoses of Thoracic spondylosis with radiculopathy (Left) and Bulge of thoracic disc without myelopathy (Left paracentral disc protrusions at T5-6 and T6-7) were also pertinent to this visit.  Visit Diagnosis: 1. Thoracic radiculitis (Location of Primary Source of Pain) (Left)   2. Thoracic spondylosis with radiculopathy (Left)   3. Bulge of thoracic disc without myelopathy (Left paracentral disc protrusions at T5-6 and T6-7)     Problem-specific Plan(s): No problem-specific assessment & plan notes found for this encounter.   Plan of Care   Problem List Items Addressed This Visit  High   Bulge of thoracic disc without myelopathy (Left paracentral disc protrusions at  T5-6 and T6-7) (Chronic)   Relevant Orders   THORACIC EPIDURAL STEROID INJECTION   Thoracic radiculitis (Location of Primary Source of Pain) (Left) - Primary (Chronic)   Relevant Orders   THORACIC EPIDURAL STEROID INJECTION   Thoracic spondylosis with radiculopathy (Left) (Chronic)   Relevant Orders   THORACIC EPIDURAL STEROID INJECTION       Pharmacotherapy (Medications Ordered): No orders of the defined types were placed in this encounter.    Lab-work & Procedure Ordered: Orders Placed This Encounter  Procedures  . THORACIC EPIDURAL STEROID INJECTION    For the upper back pain.    Standing Status: Standing     Number of Occurrences: 1     Standing Expiration Date: 06/28/2016    Scheduling Instructions:     Side: Left-sided (T5-6)     Sedation: No Sedation.     Timeframe: PRN Procedure. Patient will call to schedule.    Order Specific Question:  Where will this procedure be performed?    Answer:  [facility]    Imaging Ordered: None  Interventional Therapies: Scheduled:  None at this time.    Considering:  Palliative treatments.    PRN Procedures:  Palliative left T5-6 thoracic epidural steroid injection under fluoroscopic guidance, no sedation.    Referral(s) or Consult(s): None at this time.  Medications administered during this visit: Ms. Matos had no medications administered during this visit.  Requested PM Follow-up: Return if symptoms worsen or fail to improve, for Procedure (PRN - Patient will call).  No future appointments.  Primary Care Physician: Rusty Aus, MD Location: [facility] Note by: Kathlen Brunswick. Dossie Arbour, M.D, DABA, DABAPM, DABPM, DABIPP, FIPP  Pain Score Disclaimer: We use the NRS-11 scale. This is a self-reported, subjective measurement of pain severity with only modest accuracy. It is used primarily to identify changes within a particular patient. It must be understood that outpatient pain scales  are significantly less accurate that those used for research, where they can be applied under ideal controlled circumstances with minimal exposure to variables. In reality, the score is likely to be a combination of pain intensity and pain affect, where pain affect describes the degree of emotional arousal or changes in action readiness caused by the sensory experience of pain. Factors such as social and work situation, setting, emotional state, anxiety levels, expectation, and prior pain experience may influence pain perception and show large inter-individual differences that may also be affected by time variables.  Patient instructions provided during this appointment: There are no Patient Instructions on file for this visit.

## 2015-06-29 NOTE — Progress Notes (Signed)
Safety precautions to be maintained throughout the outpatient stay will include: orient to surroundings, keep bed in low position, maintain call bell within reach at all times, provide assistance with transfer out of bed and ambulation.  

## 2015-07-11 ENCOUNTER — Ambulatory Visit: Payer: 59 | Admitting: Pain Medicine

## 2016-01-20 ENCOUNTER — Other Ambulatory Visit: Payer: Self-pay | Admitting: Internal Medicine

## 2016-01-20 DIAGNOSIS — N63 Unspecified lump in unspecified breast: Secondary | ICD-10-CM

## 2016-02-09 ENCOUNTER — Ambulatory Visit
Admission: RE | Admit: 2016-02-09 | Discharge: 2016-02-09 | Disposition: A | Discharge status: Home / Self Care | Payer: 59 | Source: Ambulatory Visit | Attending: Internal Medicine | Admitting: Internal Medicine

## 2016-02-09 DIAGNOSIS — N63 Unspecified lump in unspecified breast: Secondary | ICD-10-CM | POA: Diagnosis present

## 2016-02-09 DIAGNOSIS — N6320 Unspecified lump in the left breast, unspecified quadrant: Secondary | ICD-10-CM | POA: Diagnosis not present

## 2016-04-04 DIAGNOSIS — M542 Cervicalgia: Secondary | ICD-10-CM | POA: Diagnosis not present

## 2016-07-13 DIAGNOSIS — E118 Type 2 diabetes mellitus with unspecified complications: Secondary | ICD-10-CM | POA: Diagnosis not present

## 2016-07-13 DIAGNOSIS — Z0000 Encounter for general adult medical examination without abnormal findings: Secondary | ICD-10-CM | POA: Diagnosis not present

## 2016-07-20 DIAGNOSIS — E1121 Type 2 diabetes mellitus with diabetic nephropathy: Secondary | ICD-10-CM | POA: Diagnosis not present

## 2016-07-20 DIAGNOSIS — E118 Type 2 diabetes mellitus with unspecified complications: Secondary | ICD-10-CM | POA: Diagnosis not present

## 2016-07-20 DIAGNOSIS — M1A00X0 Idiopathic chronic gout, unspecified site, without tophus (tophi): Secondary | ICD-10-CM | POA: Diagnosis not present

## 2016-11-15 DIAGNOSIS — L03119 Cellulitis of unspecified part of limb: Secondary | ICD-10-CM | POA: Diagnosis not present

## 2016-11-15 DIAGNOSIS — L509 Urticaria, unspecified: Secondary | ICD-10-CM | POA: Diagnosis not present

## 2016-12-11 DIAGNOSIS — M76899 Other specified enthesopathies of unspecified lower limb, excluding foot: Secondary | ICD-10-CM | POA: Diagnosis not present

## 2017-01-23 DIAGNOSIS — E118 Type 2 diabetes mellitus with unspecified complications: Secondary | ICD-10-CM | POA: Diagnosis not present

## 2017-01-23 DIAGNOSIS — M1A00X0 Idiopathic chronic gout, unspecified site, without tophus (tophi): Secondary | ICD-10-CM | POA: Diagnosis not present

## 2017-01-30 DIAGNOSIS — E1121 Type 2 diabetes mellitus with diabetic nephropathy: Secondary | ICD-10-CM | POA: Diagnosis not present

## 2017-01-30 DIAGNOSIS — E118 Type 2 diabetes mellitus with unspecified complications: Secondary | ICD-10-CM | POA: Diagnosis not present

## 2017-01-30 DIAGNOSIS — Z0000 Encounter for general adult medical examination without abnormal findings: Secondary | ICD-10-CM | POA: Diagnosis not present

## 2017-03-29 DIAGNOSIS — M5010 Cervical disc disorder with radiculopathy, unspecified cervical region: Secondary | ICD-10-CM | POA: Diagnosis not present

## 2017-03-29 DIAGNOSIS — R079 Chest pain, unspecified: Secondary | ICD-10-CM | POA: Diagnosis not present

## 2017-05-14 DIAGNOSIS — I10 Essential (primary) hypertension: Secondary | ICD-10-CM | POA: Diagnosis not present

## 2017-05-14 DIAGNOSIS — R600 Localized edema: Secondary | ICD-10-CM | POA: Diagnosis not present

## 2017-07-24 DIAGNOSIS — E039 Hypothyroidism, unspecified: Secondary | ICD-10-CM | POA: Diagnosis not present

## 2017-07-24 DIAGNOSIS — E118 Type 2 diabetes mellitus with unspecified complications: Secondary | ICD-10-CM | POA: Diagnosis not present

## 2017-08-07 DIAGNOSIS — E118 Type 2 diabetes mellitus with unspecified complications: Secondary | ICD-10-CM | POA: Diagnosis not present

## 2017-08-07 DIAGNOSIS — M1A00X0 Idiopathic chronic gout, unspecified site, without tophus (tophi): Secondary | ICD-10-CM | POA: Diagnosis not present

## 2017-08-07 DIAGNOSIS — E7800 Pure hypercholesterolemia, unspecified: Secondary | ICD-10-CM | POA: Diagnosis not present

## 2017-08-20 DIAGNOSIS — M79672 Pain in left foot: Secondary | ICD-10-CM | POA: Diagnosis not present

## 2018-02-10 DIAGNOSIS — E7800 Pure hypercholesterolemia, unspecified: Secondary | ICD-10-CM | POA: Diagnosis not present

## 2018-02-10 DIAGNOSIS — M1A00X0 Idiopathic chronic gout, unspecified site, without tophus (tophi): Secondary | ICD-10-CM | POA: Diagnosis not present

## 2018-02-10 DIAGNOSIS — E118 Type 2 diabetes mellitus with unspecified complications: Secondary | ICD-10-CM | POA: Diagnosis not present

## 2018-02-18 DIAGNOSIS — E7800 Pure hypercholesterolemia, unspecified: Secondary | ICD-10-CM | POA: Diagnosis not present

## 2018-02-18 DIAGNOSIS — Z0000 Encounter for general adult medical examination without abnormal findings: Secondary | ICD-10-CM | POA: Diagnosis not present

## 2018-02-18 DIAGNOSIS — E118 Type 2 diabetes mellitus with unspecified complications: Secondary | ICD-10-CM | POA: Diagnosis not present

## 2018-05-12 ENCOUNTER — Emergency Department: Payer: 59

## 2018-05-12 ENCOUNTER — Other Ambulatory Visit: Payer: Self-pay

## 2018-05-12 ENCOUNTER — Encounter: Payer: Self-pay | Admitting: Emergency Medicine

## 2018-05-12 ENCOUNTER — Emergency Department
Admission: EM | Admit: 2018-05-12 | Discharge: 2018-05-12 | Disposition: A | Discharge status: Home / Self Care | Payer: 59 | Attending: Emergency Medicine | Admitting: Emergency Medicine

## 2018-05-12 DIAGNOSIS — R531 Weakness: Secondary | ICD-10-CM | POA: Insufficient documentation

## 2018-05-12 DIAGNOSIS — Z79899 Other long term (current) drug therapy: Secondary | ICD-10-CM | POA: Diagnosis not present

## 2018-05-12 DIAGNOSIS — E039 Hypothyroidism, unspecified: Secondary | ICD-10-CM | POA: Diagnosis not present

## 2018-05-12 DIAGNOSIS — R29818 Other symptoms and signs involving the nervous system: Secondary | ICD-10-CM | POA: Diagnosis not present

## 2018-05-12 DIAGNOSIS — Z7984 Long term (current) use of oral hypoglycemic drugs: Secondary | ICD-10-CM | POA: Insufficient documentation

## 2018-05-12 DIAGNOSIS — R5383 Other fatigue: Secondary | ICD-10-CM | POA: Diagnosis not present

## 2018-05-12 DIAGNOSIS — E860 Dehydration: Secondary | ICD-10-CM | POA: Insufficient documentation

## 2018-05-12 DIAGNOSIS — E119 Type 2 diabetes mellitus without complications: Secondary | ICD-10-CM | POA: Diagnosis not present

## 2018-05-12 DIAGNOSIS — I10 Essential (primary) hypertension: Secondary | ICD-10-CM | POA: Insufficient documentation

## 2018-05-12 DIAGNOSIS — I639 Cerebral infarction, unspecified: Secondary | ICD-10-CM | POA: Diagnosis not present

## 2018-05-12 LAB — BASIC METABOLIC PANEL
Anion gap: 14 (ref 5–15)
BUN: 19 mg/dL (ref 6–20)
CALCIUM: 8.7 mg/dL — AB (ref 8.9–10.3)
CO2: 22 mmol/L (ref 22–32)
CREATININE: 1.62 mg/dL — AB (ref 0.44–1.00)
Chloride: 103 mmol/L (ref 98–111)
GFR calc non Af Amer: 34 mL/min — ABNORMAL LOW (ref >60)
GFR, EST AFRICAN AMERICAN: 40 mL/min — AB (ref >60)
GLUCOSE: 115 mg/dL — AB (ref 70–99)
Potassium: 4.3 mmol/L (ref 3.5–5.1)
Sodium: 139 mmol/L (ref 135–145)

## 2018-05-12 LAB — CBC
HEMATOCRIT: 36.7 % (ref 36.0–46.0)
Hemoglobin: 11.4 g/dL — ABNORMAL LOW (ref 12.0–15.0)
MCH: 26.8 pg (ref 26.0–34.0)
MCHC: 31.1 g/dL (ref 30.0–36.0)
MCV: 86.2 fL (ref 80.0–100.0)
PLATELETS: 321 10*3/uL (ref 150–400)
RBC: 4.26 MIL/uL (ref 3.87–5.11)
RDW: 15.6 % — AB (ref 11.5–15.5)
WBC: 16.7 10*3/uL — ABNORMAL HIGH (ref 4.0–10.5)
nRBC: 0.1 % (ref 0.0–0.2)

## 2018-05-12 LAB — URINALYSIS, COMPLETE (UACMP) WITH MICROSCOPIC
Bacteria, UA: NONE SEEN
Bilirubin Urine: NEGATIVE
GLUCOSE, UA: NEGATIVE mg/dL
HGB URINE DIPSTICK: NEGATIVE
Ketones, ur: NEGATIVE mg/dL
Leukocytes,Ua: NEGATIVE
Nitrite: NEGATIVE
PH: 5 (ref 5.0–8.0)
Protein, ur: NEGATIVE mg/dL
SPECIFIC GRAVITY, URINE: 1.006 (ref 1.005–1.030)

## 2018-05-12 LAB — GLUCOSE, CAPILLARY: Glucose-Capillary: 101 mg/dL — ABNORMAL HIGH (ref 70–99)

## 2018-05-12 LAB — INFLUENZA PANEL BY PCR (TYPE A & B)
INFLBPCR: NEGATIVE
Influenza A By PCR: NEGATIVE

## 2018-05-12 LAB — T4, FREE: Free T4: 1.66 ng/dL (ref 0.82–1.77)

## 2018-05-12 LAB — TSH: TSH: 3.156 u[IU]/mL (ref 0.350–4.500)

## 2018-05-12 MED ORDER — SODIUM CHLORIDE 0.9% FLUSH
3.0000 mL | Freq: Once | INTRAVENOUS | Status: AC
  Administered 2018-05-12: 3 mL via INTRAVENOUS

## 2018-05-12 MED ORDER — SODIUM CHLORIDE 0.9 % IV BOLUS
1000.0000 mL | Freq: Once | INTRAVENOUS | Status: AC
  Administered 2018-05-12: 1000 mL via INTRAVENOUS

## 2018-05-12 NOTE — Discharge Instructions (Addendum)
Please drink plenty of fluids of neck several days.  Follow-up with your doctor this week for recheck/reevaluation.  Return to the emergency department for any symptoms personally concerning to yourself.

## 2018-05-12 NOTE — ED Triage Notes (Signed)
Patient has bilateral weakness and can only hold arms up for about 4 seconds.  Seems to be equal for right and left.

## 2018-05-12 NOTE — ED Triage Notes (Signed)
Says for a week she doesn't feel like herself.  Patient answers questions.  Then lays her head back and rolls her eyes back and forth.  But she still answers and nods her head.  She denies any pain.

## 2018-05-12 NOTE — ED Provider Notes (Signed)
[facility] Emergency Department Provider Note  Time seen: 3:22 PM  I have reviewed the triage vital signs and the nursing notes.   HISTORY  Chief Complaint Weakness   HPI Claudia Hunter is a 63 y.o. female with a past medical history of diabetes, hypertension, neuropathy, presents to the emergency department with generalized weakness and fatigue.  According to the patient over the past 3 days she has had generalized fatigue and weakness.  Patient denies any fever, cough, congestion, nausea, vomiting, diarrhea, dysuria.  Denies any travel.  Denies any focal weakness or numbness.  Denies headache confusion or slurred speech.   Past Medical History:  Diagnosis Date  . Diabetes mellitus without complication ([place])   . Diverticulitis   . Gout   . Hypertension   . Neuropathy     Patient Active Problem List   Diagnosis Date Noted  . Diverticulosis of colon 05/03/2015  . Bulge of thoracic disc without myelopathy (Left paracentral disc protrusions at T5-6 and T6-7) 05/03/2015  . Thoracic radiculitis (Location of Primary Source of Pain) (Left) 05/03/2015  . Thoracic spondylosis with radiculopathy (Left) 05/03/2015  . Macroalbuminuric diabetic nephropathy ([place]) 01/19/2015  . Osteoarthritis of knee (Bilateral) (L>R) 08/02/2014  . Complication of diabetes mellitus ([place]) 11/12/2013  . Essential (primary) hypertension 11/12/2013  . Pure hypercholesterolemia 11/12/2013  . Gout 09/07/2013  . Adult hypothyroidism 09/07/2013  . Esophagitis, reflux 09/07/2013    Past Surgical History:  Procedure Laterality Date  . ABDOMINAL HYSTERECTOMY    . CESAREAN SECTION    . CHOLECYSTECTOMY      Prior to Admission medications   Medication Sig Start Date End Date Taking? Authorizing Provider  allopurinol (ZYLOPRIM) 300 MG tablet Take 300 mg by mouth daily.    [provider]  atenolol (TENORMIN) 50 MG tablet Take 50 mg by mouth daily.    [provider]   BIOTIN PO Take 1 tablet by mouth daily.    [provider]  Cholecalciferol (VITAMIN D-1000 MAX ST) 1000 units tablet Take by mouth.    [provider]  Cyanocobalamin (RA VITAMIN B-12 TR) 1000 MCG TBCR Take by mouth. Reported on 05/24/2015    [provider]  cyclobenzaprine (FLEXERIL) 10 MG tablet take 1 tablet by mouth twice a day for MUSCLES 04/15/15   [provider]  Ferrous Gluconate (FERGON PO) Take 2 tablets by mouth daily.    [provider]  gabapentin (NEURONTIN) 300 MG capsule take 1 capsule by mouth three times a day 04/11/15   [provider]  HYDROcodone-acetaminophen (NORCO/VICODIN) 5-325 MG per tablet Take 1 tablet by mouth every 6 (six) hours as needed for moderate pain. Reported on 06/29/2015    [provider]  Iron-Vitamin C (VITRON-C) 65-125 MG TABS Take 1 tablet by mouth daily.    [provider]  losartan-hydrochlorothiazide Konrad Penta) 100-25 MG tablet Take by mouth. 12/27/14   [provider]  metFORMIN (GLUCOPHAGE) 500 MG tablet Take 500 mg by mouth 2 (two) times daily with a meal.    [provider]  naproxen (NAPROSYN) 500 MG tablet Take 1 tablet (500 mg total) by mouth 2 (two) times daily. 09/20/13   Jola Schmidt, MD  pantoprazole (PROTONIX) 40 MG tablet Take 40 mg by mouth daily.    [provider]  traMADol (ULTRAM) 50 MG tablet Take 50 mg by mouth every 6 (six) hours as needed for moderate pain.    [provider]  zolpidem (AMBIEN) 10 MG  tablet Take by mouth. 11/12/13   [provider]    Allergies  Allergen Reactions  . Asa [Aspirin] Nausea And Vomiting    Other reaction(s): Other (See Comments) Tremors, vertigo dizziness   . Celecoxib     Other reaction(s): Dizziness  . Clemastine     Other reaction(s): Unknown  . Limonene     Other reaction(s): Nausea And Vomiting dizziness  . Percocet [Oxycodone-Acetaminophen]     Other reaction(s):  Hallucination hallucinations    Family History  Problem Relation Age of Onset  . Cancer Mother   . Heart disease Father   . Breast cancer Maternal Aunt        great    Social History Social History   Tobacco Use  . Smoking status: Never Smoker  . Smokeless tobacco: Never Used  Substance Use Topics  . Alcohol use: No  . Drug use: Not on file    Review of Systems Constitutional: Negative for fever.  Positive for generalized fatigue/weakness. Cardiovascular: Negative for chest pain. Respiratory: Negative for shortness of breath. Gastrointestinal: Negative for abdominal pain, vomiting and diarrhea. Genitourinary: Negative for urinary compaints Musculoskeletal: Negative for musculoskeletal complaints Skin: Negative for skin complaints  Neurological: Negative for headache All other ROS negative  ____________________________________________   PHYSICAL EXAM:  VITAL SIGNS: ED Triage Vitals [05/12/18 1206]  Enc Vitals Group     BP (!) 143/87     Pulse Rate 99     Resp 16     Temp 98.3 F (36.8 C)     Temp Source Oral     SpO2 99 %     Weight 255 lb 11.7 oz (116 kg)     Height 5\' 6"  (1.676 m)     Head Circumference      Peak Flow      Pain Score 0     Pain Loc      Pain Edu?      Excl. in [place]?    Constitutional: Alert and oriented. Well appearing and in no distress.   Eyes: Normal exam ENT   Head: Normocephalic and atraumatic.   Mouth/Throat: Mucous membranes are moist. Cardiovascular: Normal rate, regular rhythm. Respiratory: Normal respiratory effort without tachypnea nor retractions. Breath sounds are clear  Gastrointestinal: Soft and nontender. No distention.   Musculoskeletal: Nontender with normal range of motion in all extremities.  Neurologic:  Normal speech and language. No gross focal neurologic deficits Skin:  Skin is warm, dry and intact.  Psychiatric: Mood and affect are normal.   ____________________________________________     EKG  EKG viewed and interpreted by myself shows a sinus rhythm at 100 bpm with a narrow QRS, normal axis, largely normal intervals with nonspecific but no concerning ST changes.  ____________________________________________    RADIOLOGY  CT scan head is negative. Chest x-ray is negative.  ____________________________________________   INITIAL IMPRESSION / ASSESSMENT AND PLAN / ED COURSE  Pertinent labs & imaging results that were available during my care of the patient were reviewed by me and considered in my medical decision making (see chart for details).  Patient presents to the emergency department with generalized fatigue and weakness ongoing for 3 days.  Patient has no fever, cough, congestion, vomiting, diarrhea, dysuria.  Largely negative review of systems.  Patient's lab work shows a moderate leukocytosis of 16,000 but a negative urinalysis.  Chemistry shows no concerning findings, besides possibly mild renal insufficiency/dehydration.  We will IV hydrate, given the patient's generalized weakness we will obtain  a CT scan of her head, chest x-ray I have added on thyroid hormone testing as well as a flu test.   Patient's labs are overall reassuring mild renal insufficiency/dehydration, otherwise largely normal.  CT scan of the head is negative, chest x-ray is normal.  EKG shows no concerning findings.  Lab work is largely within normal limits as well.  Urine is normal without infection.  Thyroid testing is normal.  Chemistry is normal, moderate leukocytosis but otherwise normal.  I discussed with the patient drinking plenty of fluids.  Patient appears very well currently.  I discussed with the patient drinking plenty of fluids and obtaining plenty of rest at home as well as PCP follow-up.  Patient agreeable to plan of care.  ____________________________________________   FINAL CLINICAL IMPRESSION(S) / ED DIAGNOSES  Weakness Fatigue Dehydration   Harvest Dark,  MD 05/12/18 [phone]

## 2018-05-12 NOTE — ED Notes (Signed)
patient back from ct. Laying in bed reports feeling weak, declined TV controller that was offered. Awaiting ct/chest xray results for further plan of care.

## 2018-05-12 NOTE — ED Notes (Signed)
Up to commode to attempt UA spec, unable to void at this time.

## 2018-05-12 NOTE — ED Notes (Signed)
Gave pt urine cup with bag.  

## 2018-05-12 NOTE — ED Notes (Signed)
As per patient weakness x 4 days, speech is clear but slow. Answer questions appropriately. Denies pain/ F/N/V. Reports just feeling weak. Md at bedside. fluis bolus and ct pending.

## 2018-06-10 ENCOUNTER — Other Ambulatory Visit: Payer: Self-pay

## 2018-06-10 ENCOUNTER — Other Ambulatory Visit (HOSPITAL_COMMUNITY): Payer: Self-pay | Admitting: Physician Assistant

## 2018-06-10 ENCOUNTER — Other Ambulatory Visit: Payer: Self-pay | Admitting: Physician Assistant

## 2018-06-10 ENCOUNTER — Encounter: Payer: Self-pay | Admitting: Medical Oncology

## 2018-06-10 ENCOUNTER — Inpatient Hospital Stay: Payer: 59

## 2018-06-10 ENCOUNTER — Ambulatory Visit
Admission: RE | Admit: 2018-06-10 | Discharge: 2018-06-10 | Disposition: A | Discharge status: Home / Self Care | Payer: 59 | Source: Ambulatory Visit | Attending: Physician Assistant | Admitting: Physician Assistant

## 2018-06-10 ENCOUNTER — Observation Stay
Admission: EM | Admit: 2018-06-10 | Discharge: 2018-06-12 | DRG: 683 | Disposition: A | Discharge status: Home / Self Care | Payer: 59 | Attending: Internal Medicine | Admitting: Internal Medicine

## 2018-06-10 DIAGNOSIS — Z888 Allergy status to other drugs, medicaments and biological substances status: Secondary | ICD-10-CM | POA: Insufficient documentation

## 2018-06-10 DIAGNOSIS — K625 Hemorrhage of anus and rectum: Secondary | ICD-10-CM | POA: Diagnosis not present

## 2018-06-10 DIAGNOSIS — D638 Anemia in other chronic diseases classified elsewhere: Secondary | ICD-10-CM | POA: Insufficient documentation

## 2018-06-10 DIAGNOSIS — I700 Atherosclerosis of aorta: Secondary | ICD-10-CM | POA: Diagnosis not present

## 2018-06-10 DIAGNOSIS — K644 Residual hemorrhoidal skin tags: Secondary | ICD-10-CM | POA: Diagnosis not present

## 2018-06-10 DIAGNOSIS — K648 Other hemorrhoids: Secondary | ICD-10-CM | POA: Diagnosis not present

## 2018-06-10 DIAGNOSIS — D72829 Elevated white blood cell count, unspecified: Secondary | ICD-10-CM

## 2018-06-10 DIAGNOSIS — I10 Essential (primary) hypertension: Secondary | ICD-10-CM | POA: Insufficient documentation

## 2018-06-10 DIAGNOSIS — Z6841 Body Mass Index (BMI) 40.0 and over, adult: Secondary | ICD-10-CM | POA: Diagnosis not present

## 2018-06-10 DIAGNOSIS — Z9049 Acquired absence of other specified parts of digestive tract: Secondary | ICD-10-CM | POA: Insufficient documentation

## 2018-06-10 DIAGNOSIS — R1032 Left lower quadrant pain: Secondary | ICD-10-CM

## 2018-06-10 DIAGNOSIS — D125 Benign neoplasm of sigmoid colon: Secondary | ICD-10-CM | POA: Insufficient documentation

## 2018-06-10 DIAGNOSIS — E039 Hypothyroidism, unspecified: Secondary | ICD-10-CM | POA: Insufficient documentation

## 2018-06-10 DIAGNOSIS — Z791 Long term (current) use of non-steroidal anti-inflammatories (NSAID): Secondary | ICD-10-CM | POA: Insufficient documentation

## 2018-06-10 DIAGNOSIS — E871 Hypo-osmolality and hyponatremia: Secondary | ICD-10-CM | POA: Diagnosis not present

## 2018-06-10 DIAGNOSIS — Z8249 Family history of ischemic heart disease and other diseases of the circulatory system: Secondary | ICD-10-CM | POA: Insufficient documentation

## 2018-06-10 DIAGNOSIS — K5730 Diverticulosis of large intestine without perforation or abscess without bleeding: Secondary | ICD-10-CM | POA: Diagnosis not present

## 2018-06-10 DIAGNOSIS — Z794 Long term (current) use of insulin: Secondary | ICD-10-CM | POA: Insufficient documentation

## 2018-06-10 DIAGNOSIS — R109 Unspecified abdominal pain: Secondary | ICD-10-CM

## 2018-06-10 DIAGNOSIS — E1121 Type 2 diabetes mellitus with diabetic nephropathy: Secondary | ICD-10-CM | POA: Insufficient documentation

## 2018-06-10 DIAGNOSIS — E785 Hyperlipidemia, unspecified: Secondary | ICD-10-CM | POA: Insufficient documentation

## 2018-06-10 DIAGNOSIS — D124 Benign neoplasm of descending colon: Secondary | ICD-10-CM | POA: Insufficient documentation

## 2018-06-10 DIAGNOSIS — K760 Fatty (change of) liver, not elsewhere classified: Secondary | ICD-10-CM | POA: Insufficient documentation

## 2018-06-10 DIAGNOSIS — Z8059 Family history of malignant neoplasm of other urinary tract organ: Secondary | ICD-10-CM | POA: Insufficient documentation

## 2018-06-10 DIAGNOSIS — Z885 Allergy status to narcotic agent status: Secondary | ICD-10-CM | POA: Insufficient documentation

## 2018-06-10 DIAGNOSIS — E861 Hypovolemia: Secondary | ICD-10-CM | POA: Insufficient documentation

## 2018-06-10 DIAGNOSIS — N179 Acute kidney failure, unspecified: Principal | ICD-10-CM | POA: Diagnosis present

## 2018-06-10 DIAGNOSIS — M109 Gout, unspecified: Secondary | ICD-10-CM | POA: Diagnosis not present

## 2018-06-10 DIAGNOSIS — M1990 Unspecified osteoarthritis, unspecified site: Secondary | ICD-10-CM | POA: Insufficient documentation

## 2018-06-10 DIAGNOSIS — R1084 Generalized abdominal pain: Secondary | ICD-10-CM | POA: Diagnosis not present

## 2018-06-10 DIAGNOSIS — Z886 Allergy status to analgesic agent status: Secondary | ICD-10-CM | POA: Diagnosis not present

## 2018-06-10 DIAGNOSIS — Z803 Family history of malignant neoplasm of breast: Secondary | ICD-10-CM | POA: Insufficient documentation

## 2018-06-10 DIAGNOSIS — G709 Myoneural disorder, unspecified: Secondary | ICD-10-CM | POA: Insufficient documentation

## 2018-06-10 LAB — URINALYSIS, COMPLETE (UACMP) WITH MICROSCOPIC
Bacteria, UA: NONE SEEN
Bilirubin Urine: NEGATIVE
Glucose, UA: NEGATIVE mg/dL
Hgb urine dipstick: NEGATIVE
Ketones, ur: NEGATIVE mg/dL
Leukocytes,Ua: NEGATIVE
Nitrite: NEGATIVE
Protein, ur: 30 mg/dL — AB
Specific Gravity, Urine: 1.018 (ref 1.005–1.030)
pH: 5 (ref 5.0–8.0)

## 2018-06-10 LAB — COMPREHENSIVE METABOLIC PANEL
ALT: 14 U/L (ref 0–44)
AST: 16 U/L (ref 15–41)
Albumin: 4.2 g/dL (ref 3.5–5.0)
Alkaline Phosphatase: 96 U/L (ref 38–126)
Anion gap: 14 (ref 5–15)
BUN: 34 mg/dL — ABNORMAL HIGH (ref 6–20)
CO2: 21 mmol/L — ABNORMAL LOW (ref 22–32)
Calcium: 8.4 mg/dL — ABNORMAL LOW (ref 8.9–10.3)
Chloride: 99 mmol/L (ref 98–111)
Creatinine, Ser: 3.01 mg/dL — ABNORMAL HIGH (ref 0.44–1.00)
GFR calc Af Amer: 19 mL/min — ABNORMAL LOW (ref >60)
GFR calc non Af Amer: 16 mL/min — ABNORMAL LOW (ref >60)
Glucose, Bld: 111 mg/dL — ABNORMAL HIGH (ref 70–99)
Potassium: 3.9 mmol/L (ref 3.5–5.1)
Sodium: 134 mmol/L — ABNORMAL LOW (ref 135–145)
Total Bilirubin: 0.7 mg/dL (ref 0.3–1.2)
Total Protein: 8.1 g/dL (ref 6.5–8.1)

## 2018-06-10 LAB — CBC
HCT: 37.2 % (ref 36.0–46.0)
Hemoglobin: 11.6 g/dL — ABNORMAL LOW (ref 12.0–15.0)
MCH: 26.8 pg (ref 26.0–34.0)
MCHC: 31.2 g/dL (ref 30.0–36.0)
MCV: 85.9 fL (ref 80.0–100.0)
Platelets: 393 10*3/uL (ref 150–400)
RBC: 4.33 MIL/uL (ref 3.87–5.11)
RDW: 15.6 % — ABNORMAL HIGH (ref 11.5–15.5)
WBC: 16 10*3/uL — ABNORMAL HIGH (ref 4.0–10.5)
nRBC: 0.1 % (ref 0.0–0.2)

## 2018-06-10 LAB — BLOOD GAS, VENOUS
Acid-base deficit: 3.3 mmol/L — ABNORMAL HIGH (ref 0.0–2.0)
Bicarbonate: 24 mmol/L (ref 20.0–28.0)
FIO2: 0.21
O2 Saturation: 24.7 %
Patient temperature: 37
pCO2, Ven: 51 mmHg (ref 44.0–60.0)
pH, Ven: 7.28 (ref 7.250–7.430)
pO2, Ven: <31 mmHg — CL (ref 32.0–45.0)

## 2018-06-10 LAB — LIPASE, BLOOD: Lipase: 28 U/L (ref 11–51)

## 2018-06-10 LAB — TROPONIN I: Troponin I: <0.03 ng/mL (ref <0.03)

## 2018-06-10 LAB — LACTIC ACID, PLASMA
Lactic Acid, Venous: 2.1 mmol/L (ref 0.5–1.9)
Lactic Acid, Venous: 1.3 mmol/L (ref 0.5–1.9)

## 2018-06-10 MED ORDER — ONDANSETRON HCL 4 MG/2ML IJ SOLN
4.0000 mg | Freq: Once | INTRAMUSCULAR | Status: AC
  Administered 2018-06-10: 4 mg via INTRAVENOUS
  Filled 2018-06-10: qty 2

## 2018-06-10 MED ORDER — ONDANSETRON HCL 4 MG/2ML IJ SOLN
4.0000 mg | Freq: Four times a day (QID) | INTRAMUSCULAR | Status: DC | PRN
  Administered 2018-06-12: 4 mg via INTRAVENOUS
  Filled 2018-06-10: qty 2

## 2018-06-10 MED ORDER — PANTOPRAZOLE SODIUM 40 MG IV SOLR
40.0000 mg | Freq: Two times a day (BID) | INTRAVENOUS | Status: DC
  Administered 2018-06-10 – 2018-06-11 (×3): 40 mg via INTRAVENOUS
  Filled 2018-06-10 (×3): qty 40

## 2018-06-10 MED ORDER — FENTANYL CITRATE (PF) 100 MCG/2ML IJ SOLN
50.0000 ug | Freq: Once | INTRAMUSCULAR | Status: AC
  Administered 2018-06-10: 18:00:00 50 ug via INTRAVENOUS
  Filled 2018-06-10: qty 2

## 2018-06-10 MED ORDER — SODIUM CHLORIDE 0.9 % IV BOLUS
1000.0000 mL | Freq: Once | INTRAVENOUS | Status: AC
  Administered 2018-06-10: 18:00:00 1000 mL via INTRAVENOUS

## 2018-06-10 MED ORDER — ALLOPURINOL 100 MG PO TABS
300.0000 mg | ORAL_TABLET | Freq: Every day | ORAL | Status: DC
  Administered 2018-06-11: 11:00:00 300 mg via ORAL
  Filled 2018-06-10: qty 3

## 2018-06-10 MED ORDER — ATENOLOL 50 MG PO TABS
50.0000 mg | ORAL_TABLET | Freq: Every day | ORAL | Status: DC
  Administered 2018-06-11 – 2018-06-12 (×2): 50 mg via ORAL
  Filled 2018-06-10 (×2): qty 1

## 2018-06-10 MED ORDER — PRAVASTATIN SODIUM 20 MG PO TABS
20.0000 mg | ORAL_TABLET | Freq: Every evening | ORAL | Status: DC
  Administered 2018-06-10 – 2018-06-11 (×2): 20 mg via ORAL
  Filled 2018-06-10 (×2): qty 1

## 2018-06-10 MED ORDER — LEVOTHYROXINE SODIUM 50 MCG PO TABS
150.0000 ug | ORAL_TABLET | Freq: Every day | ORAL | Status: DC
  Administered 2018-06-11 – 2018-06-12 (×2): 150 ug via ORAL
  Filled 2018-06-10 (×2): qty 1

## 2018-06-10 MED ORDER — GABAPENTIN 100 MG PO CAPS
100.0000 mg | ORAL_CAPSULE | Freq: Two times a day (BID) | ORAL | Status: DC
  Administered 2018-06-10 – 2018-06-11 (×3): 100 mg via ORAL
  Filled 2018-06-10 (×3): qty 1

## 2018-06-10 MED ORDER — INSULIN ASPART 100 UNIT/ML ~~LOC~~ SOLN: 0.0000 [IU] | Freq: Every day | SUBCUTANEOUS | Status: DC

## 2018-06-10 MED ORDER — HYDRALAZINE HCL 20 MG/ML IJ SOLN: 5.0000 mg | INTRAMUSCULAR | Status: DC | PRN

## 2018-06-10 MED ORDER — ZOLPIDEM TARTRATE 5 MG PO TABS
5.0000 mg | ORAL_TABLET | Freq: Every day | ORAL | Status: DC
  Administered 2018-06-10 – 2018-06-11 (×2): 5 mg via ORAL
  Filled 2018-06-10 (×2): qty 1

## 2018-06-10 MED ORDER — CYCLOBENZAPRINE HCL 10 MG PO TABS: 10.0000 mg | ORAL_TABLET | Freq: Three times a day (TID) | ORAL | Status: DC | PRN

## 2018-06-10 MED ORDER — ACETAMINOPHEN 650 MG RE SUPP: 650.0000 mg | Freq: Four times a day (QID) | RECTAL | Status: DC | PRN

## 2018-06-10 MED ORDER — TRAMADOL HCL 50 MG PO TABS
50.0000 mg | ORAL_TABLET | Freq: Two times a day (BID) | ORAL | Status: DC | PRN
  Administered 2018-06-10 – 2018-06-11 (×2): 50 mg via ORAL
  Filled 2018-06-10 (×2): qty 1

## 2018-06-10 MED ORDER — ONDANSETRON HCL 4 MG PO TABS: 4.0000 mg | ORAL_TABLET | Freq: Four times a day (QID) | ORAL | Status: DC | PRN

## 2018-06-10 MED ORDER — POLYETHYLENE GLYCOL 3350 17 G PO PACK: 17.0000 g | PACK | Freq: Every day | ORAL | Status: DC | PRN

## 2018-06-10 MED ORDER — INSULIN ASPART 100 UNIT/ML ~~LOC~~ SOLN
0.0000 [IU] | Freq: Three times a day (TID) | SUBCUTANEOUS | Status: DC
  Administered 2018-06-12: 08:00:00 1 [IU] via SUBCUTANEOUS
  Filled 2018-06-10: qty 1

## 2018-06-10 MED ORDER — ACETAMINOPHEN 325 MG PO TABS: 650.0000 mg | ORAL_TABLET | Freq: Four times a day (QID) | ORAL | Status: DC | PRN

## 2018-06-10 MED ORDER — SODIUM CHLORIDE 0.9 % IV SOLN
INTRAVENOUS | Status: DC
  Administered 2018-06-10 – 2018-06-12 (×3): via INTRAVENOUS

## 2018-06-10 NOTE — ED Notes (Signed)
ED TO INPATIENT HANDOFF REPORT  ED Nurse Name and Phone #: [phone]  S Name/Age/Gender Claudia Hunter 63 y.o. female Room/Bed: ED19A/ED19A  Code Status   Code Status: Not on file  Home/SNF/Other Home Patient oriented to: self, place, time and situation Is this baseline? Yes   Triage Complete: Triage complete  Chief Complaint abnormal labs  Triage Note Pt was seeing PCP today for LLQ abd pain. Sent for Abdominal CT, labs were drawn prior and noted that creatinine  was 2.8. Pt did have non contrasted ct completed.    Allergies Allergies  Allergen Reactions  . Asa [Aspirin] Nausea And Vomiting    Other reaction(s): Other (See Comments) Tremors, vertigo dizziness   . Celecoxib     Other reaction(s): Dizziness  . Clemastine     Other reaction(s): Unknown  . Limonene     Other reaction(s): Nausea And Vomiting dizziness  . Lovastatin Other (See Comments)    Made her feel like she is in trance/suicidal   . Oxycodone-Acetaminophen Other (See Comments)  . Percocet [Oxycodone-Acetaminophen]     Other reaction(s): Hallucination hallucinations    Level of Care/Admitting Diagnosis ED Disposition    ED Disposition Condition [facility] Area: [place] [facility] [[id number]]  Level of Care: Med-Surg [16]  Covid Evaluation: N/A  Diagnosis: AKI (acute kidney injury) [facility]) [[id number]]  Admitting Physician: Hyman Bible DODD [[id number]]  Attending Physician: Hyman Bible DODD [[id number]]  Estimated length of stay: past midnight tomorrow  Certification:: I certify this patient will need inpatient services for at least 2 midnights  PT Class (Do Not Modify): Inpatient [101]  PT Acc Code (Do Not Modify): Private [1]       B Medical/Surgery History Past Medical History:  Diagnosis Date  . Diabetes mellitus without complication ([place])   . Diverticulitis   . Gout   . Hypertension   . Neuropathy    Past Surgical History:  Procedure Laterality Date  .  ABDOMINAL HYSTERECTOMY    . CESAREAN SECTION    . CHOLECYSTECTOMY       A IV Location/Drains/Wounds Patient Lines/Drains/Airways Status   Active Line/Drains/Airways    Name:   Placement date:   Placement time:   Site:   Days:   Peripheral IV 06/10/18 Right Antecubital   06/10/18    1820    Antecubital   less than 1          Intake/Output Last 24 hours No intake or output data in the 24 hours ending 06/10/18 2154  Labs/Imaging Results for orders placed or performed during the hospital encounter of 06/10/18 (from the past 48 hour(s))  Lipase, blood     Status: None   Collection Time: 06/10/18  4:45 PM  Result Value Ref Range   Lipase 28 11 - 51 U/L    Comment: Performed at [facility], [address] [place], [place] [address]  Comprehensive metabolic panel     Status: Abnormal   Collection Time: 06/10/18  4:45 PM  Result Value Ref Range   Sodium 134 (L) 135 - 145 mmol/L   Potassium 3.9 3.5 - 5.1 mmol/L   Chloride 99 98 - 111 mmol/L   CO2 21 (L) 22 - 32 mmol/L   Glucose, Bld 111 (H) 70 - 99 mg/dL   BUN 34 (H) 6 - 20 mg/dL   Creatinine, Ser 3.01 (H) 0.44 - 1.00 mg/dL   Calcium 8.4 (L) 8.9 - 10.3 mg/dL   Total Protein  8.1 6.5 - 8.1 g/dL   Albumin 4.2 3.5 - 5.0 g/dL   AST 16 15 - 41 U/L   ALT 14 0 - 44 U/L   Alkaline Phosphatase 96 38 - 126 U/L   Total Bilirubin 0.7 0.3 - 1.2 mg/dL   GFR calc non Af Amer 16 (L) >60 mL/min   GFR calc Af Amer 19 (L) >60 mL/min   Anion gap 14 5 - 15    Comment: Performed at [facility], [address] [place], [place] [address]  CBC     Status: Abnormal   Collection Time: 06/10/18  4:45 PM  Result Value Ref Range   WBC 16.0 (H) 4.0 - 10.5 K/uL   RBC 4.33 3.87 - 5.11 MIL/uL   Hemoglobin 11.6 (L) 12.0 - 15.0 g/dL   HCT 37.2 36.0 - 46.0 %   MCV 85.9 80.0 - 100.0 fL   MCH 26.8 26.0 - 34.0 pg   MCHC 31.2 30.0 - 36.0 g/dL   RDW 15.6 (H) 11.5 - 15.5 %   Platelets 393 150 - 400 K/uL   nRBC 0.1 0.0 - 0.2 %     Comment: Performed at [facility], [address] [place], [place] [address]  Urinalysis, Complete w Microscopic     Status: Abnormal   Collection Time: 06/10/18  4:45 PM  Result Value Ref Range   Color, Urine YELLOW (A) YELLOW   APPearance CLOUDY (A) CLEAR   Specific Gravity, Urine 1.018 1.005 - 1.030   pH 5.0 5.0 - 8.0   Glucose, UA NEGATIVE NEGATIVE mg/dL   Hgb urine dipstick NEGATIVE NEGATIVE   Bilirubin Urine NEGATIVE NEGATIVE   Ketones, ur NEGATIVE NEGATIVE mg/dL   Protein, ur 30 (A) NEGATIVE mg/dL   Nitrite NEGATIVE NEGATIVE   Leukocytes,Ua NEGATIVE NEGATIVE   RBC / HPF 0-5 0 - 5 RBC/hpf   WBC, UA 0-5 0 - 5 WBC/hpf   Bacteria, UA NONE SEEN NONE SEEN   Squamous Epithelial / LPF 0-5 0 - 5   Mucus PRESENT    Hyaline Casts, UA PRESENT    Amorphous Crystal PRESENT     Comment: Performed at [facility], [address], [place], [place] [address]  Troponin I - Add-On to previous collection     Status: None   Collection Time: 06/10/18  4:45 PM  Result Value Ref Range   Troponin I <0.03 <0.03 ng/mL    Comment: Performed at [facility], [address] [place], [place] [address]  Lactic acid, plasma     Status: Abnormal   Collection Time: 06/10/18  5:44 PM  Result Value Ref Range   Lactic Acid, Venous 2.1 (HH) 0.5 - 1.9 mmol/L    Comment: CRITICAL RESULT CALLED TO, READ BACK BY AND VERIFIED WITH Claudia Hunter 06/10/18 1903 KLW Performed at [facility], [address] [place],  [address]   Blood gas, venous     Status: Abnormal   Collection Time: 06/10/18  5:46 PM  Result Value Ref Range   FIO2 0.21    Delivery systems ROOM AIR    pH, Ven 7.28 7.250 - 7.430   pCO2, Ven 51 44.0 - 60.0 mmHg   pO2, Ven <31.0 (LL) 32.0 - 45.0 mmHg    Comment: CRITICAL RESULT CALLED TO, READ BACK BY AND VERIFIED WITH: DR [facility] AT 1948 ON 06/10/18 BL    Bicarbonate 24.0 20.0 - 28.0 mmol/L   Acid-base deficit 3.3 (H) 0.0 - 2.0 mmol/L  O2  Saturation 24.7 %   Patient temperature 37.0    Collection site VENOUS    Sample type VENOUS     Comment: Performed at [facility], [address] [place], [place] [address]  Lactic acid, plasma     Status: None   Collection Time: 06/10/18  7:44 PM  Result Value Ref Range   Lactic Acid, Venous 1.3 0.5 - 1.9 mmol/L    Comment: Performed at [facility], [address], [place], [place] [address]   Ct Abdomen Pelvis Wo Contrast  Result Date: 06/10/2018 CLINICAL DATA:  Fatigue. Abdominal pain. Symptoms for 30 days. Nausea and vomiting. Frequent bowel movements. Left lower quadrant pain. EXAM: CT ABDOMEN AND PELVIS WITHOUT CONTRAST TECHNIQUE: Multidetector CT imaging of the abdomen and pelvis was performed following the standard protocol without IV contrast. COMPARISON:  12/15/2008 FINDINGS: Lower chest: Clear lung bases. Normal heart size without pericardial or pleural effusion. Hepatobiliary: Moderate hepatic steatosis. Normal liver. Cholecystectomy, without biliary ductal dilatation. Pancreas: Normal, without mass or ductal dilatation. Spleen: Normal in size, without focal abnormality. Adrenals/Urinary Tract: Normal adrenal glands. No renal calculi or hydronephrosis. The ureters are difficult to follow. No bladder calculi. Stomach/Bowel: Normal stomach, without wall thickening. Scattered colonic diverticula. Normal terminal ileum. Normal small bowel. Vascular/Lymphatic: Aortic and branch vessel atherosclerosis. No abdominopelvic adenopathy. Reproductive: Hysterectomy.  No adnexal mass. Other: No significant free fluid. Right abdominal wall surgical sutures. Musculoskeletal: Lumbosacral spondylosis. IMPRESSION: 1. No acute process in the abdomen or pelvis. No explanation for patient's symptoms. 2. Hepatic steatosis. 3.  Aortic Atherosclerosis (ICD10-I70.0). Electronically Signed   By: Abigail Miyamoto M.D.   On: 06/10/2018 16:34    Pending Labs Unresulted Labs (From admission,  onward)    Start     Ordered   06/10/18 1745  Urine culture  Once,   STAT     06/10/18 1744   Signed and Held  HIV antibody (Routine Testing)  Once,   R     Signed and Held   Signed and Held  Basic metabolic panel  Tomorrow morning,   R     Signed and Held   Signed and Held  CBC  Tomorrow morning,   R     Signed and Held   Signed and Held  H Pylori, IGM, IGG, IGA AB  Once,   R     Signed and Held          Vitals/Pain Today's Vitals   06/10/18 1855 06/10/18 1945 06/10/18 2000 06/10/18 2100  BP:  (!) 122/100 109/73 117/64  Pulse:  88  91  Resp:  13  15  Temp:      TempSrc:      SpO2:  100%  98%  Weight:      Height:      PainSc: 7        Isolation Precautions No active isolations  Medications Medications  sodium chloride 0.9 % bolus 1,000 mL (1,000 mLs Intravenous New Bag/Given 06/10/18 1825)  fentaNYL (SUBLIMAZE) injection 50 mcg (50 mcg Intravenous Given 06/10/18 1819)  ondansetron (ZOFRAN) injection 4 mg (4 mg Intravenous Given 06/10/18 1819)    Mobility walks with person assist High fall risk   Focused Assessments GI pain is under control at this time   R Recommendations: See Admitting Provider Note  Report given to:   Additional Notes:

## 2018-06-10 NOTE — ED Notes (Signed)
PT on toilet attempting to urinate Dr . Burlene Arnt is aware of lactic acid level

## 2018-06-10 NOTE — ED Provider Notes (Addendum)
[facility] Emergency Department Provider Note  ____________________________________________   I have reviewed the triage vital signs and the nursing notes. Where available I have reviewed prior notes and, if possible and indicated, outside hospital notes.    HISTORY  Chief Complaint Abdominal Pain and Abnormal Lab    HPI ERNESTINA JOE is a 63 y.o. female  With a history of diverticulitis gallbladder disease status post gallbladder resection remotely, [place] mellitus, presents today complaining of abdominal pain every day for the last month.  She denies any fever or chills.  No hematemesis.  She did have vomiting she states x2 over the last month, once today and once last weekend some time.  She denies any melena or bright red blood per rectum.  She states she is having "small round balls" for her stool.  She has had decreased p.o.  And she states that she does not want to eat.  She has had no dysuria or urinary frequency no flank pain or kidney stones she was seen as an outpatient today and had a CT scan which was read as normal over creatinine was 2.8 which is certainly elevated from her baseline.  The pain is waxing and waning but never completely gone for the last month, is in the lower abdomen, no radiation.  No other associated symptoms no other prior treatment.  She was recently seen here for feeling weak, had a CT scan of the head and extensive work-up at that time which revealed a -1.6.  Creatinine before that in our system was 1.3, 3 years ago.  He denies frank diarrhea she states she is having well formed small balls of stool.    Past Medical History:  Diagnosis Date  . Diabetes mellitus without complication ([place])   . Diverticulitis   . Gout   . Hypertension   . Neuropathy     Patient Active Problem List   Diagnosis Date Noted  . Diverticulosis of colon 05/03/2015  . Bulge of thoracic disc without myelopathy (Left paracentral disc protrusions at  T5-6 and T6-7) 05/03/2015  . Thoracic radiculitis (Location of Primary Source of Pain) (Left) 05/03/2015  . Thoracic spondylosis with radiculopathy (Left) 05/03/2015  . Macroalbuminuric diabetic nephropathy ([place]) 01/19/2015  . Osteoarthritis of knee (Bilateral) (L>R) 08/02/2014  . Complication of diabetes mellitus ([place]) 11/12/2013  . Essential (primary) hypertension 11/12/2013  . Pure hypercholesterolemia 11/12/2013  . Gout 09/07/2013  . Adult hypothyroidism 09/07/2013  . Esophagitis, reflux 09/07/2013    Past Surgical History:  Procedure Laterality Date  . ABDOMINAL HYSTERECTOMY    . CESAREAN SECTION    . CHOLECYSTECTOMY      Prior to Admission medications   Medication Sig Start Date End Date Taking? Authorizing Provider  allopurinol (ZYLOPRIM) 300 MG tablet Take 300 mg by mouth daily.    [provider]  atenolol (TENORMIN) 50 MG tablet Take 50 mg by mouth daily.    [provider]  BIOTIN PO Take 1 tablet by mouth daily.    [provider]  Cholecalciferol (VITAMIN D-1000 MAX ST) 1000 units tablet Take by mouth.    [provider]  Cyanocobalamin (RA VITAMIN B-12 TR) 1000 MCG TBCR Take by mouth. Reported on 05/24/2015    [provider]  cyclobenzaprine (FLEXERIL) 10 MG tablet take 1 tablet by mouth twice a day for MUSCLES 04/15/15   [provider]  Ferrous Gluconate (FERGON PO) Take 2 tablets by mouth daily.    [provider]  gabapentin (  NEURONTIN) 300 MG capsule take 1 capsule by mouth three times a day 04/11/15   [provider]  HYDROcodone-acetaminophen (NORCO/VICODIN) 5-325 MG per tablet Take 1 tablet by mouth every 6 (six) hours as needed for moderate pain. Reported on 06/29/2015    [provider]  Iron-Vitamin C (VITRON-C) 65-125 MG TABS Take 1 tablet by mouth daily.    [provider]  losartan-hydrochlorothiazide Konrad Penta) 100-25 MG tablet Take by mouth. 12/27/14   [provider]  metFORMIN (GLUCOPHAGE) 500 MG tablet Take 500 mg by mouth 2 (two) times daily with a meal.    [provider]  naproxen (NAPROSYN) 500 MG tablet Take 1 tablet (500 mg total) by mouth 2 (two) times daily. 09/20/13   Jola Schmidt, MD  pantoprazole (PROTONIX) 40 MG tablet Take 40 mg by mouth daily.    [provider]  traMADol (ULTRAM) 50 MG tablet Take 50 mg by mouth every 6 (six) hours as needed for moderate pain.    [provider]  zolpidem (AMBIEN) 10 MG tablet Take by mouth. 11/12/13   [provider]    Allergies Asa [aspirin]; Celecoxib; Clemastine; Limonene; and Percocet [oxycodone-acetaminophen]  Family History  Problem Relation Age of Onset  . Cancer Mother   . Heart disease Father   . Breast cancer Maternal Aunt        great    Social History Social History   Tobacco Use  . Smoking status: Never Smoker  . Smokeless tobacco: Never Used  Substance Use Topics  . Alcohol use: No  . Drug use: Not on file    Review of Systems Constitutional: No fever/chills Eyes: No visual changes. ENT: No sore throat. No stiff neck no neck pain Cardiovascular: Denies chest pain. Respiratory: Denies shortness of breath. Gastrointestinal: See HPI Genitourinary: Negative for dysuria. Musculoskeletal: Negative lower extremity swelling Skin: Negative for rash. Neurological: Negative for severe headaches, focal weakness or numbness.   ____________________________________________   PHYSICAL EXAM:  VITAL SIGNS: ED Triage Vitals  Enc Vitals Group     BP 06/10/18 1635 126/63     Pulse Rate 06/10/18 1635 100     Resp 06/10/18 1635 19     Temp 06/10/18 1635 98.6 F (37 C)     Temp Source 06/10/18 1635 Oral     SpO2 06/10/18 1635 100 %     Weight 06/10/18 1636 255 lb 11.7 oz (116 kg)     Height 06/10/18 1636 5\' 6"  (1.676 m)     Head Circumference --      Peak Flow --      Pain Score 06/10/18 1636 9     Pain Loc --      Pain Edu?  --      Excl. in [place]? --     Constitutional: Alert and oriented. Well appearing and in no acute distress. Eyes: Conjunctivae are normal Head: Atraumatic HEENT: No congestion/rhinnorhea. Mucous membranes are moist.  Oropharynx non-erythematous Neck:   Nontender with no meningismus, no masses, no stridor Cardiovascular: Normal rate, regular rhythm. Grossly normal heart sounds.  Good peripheral circulation. Respiratory: Normal respiratory effort.  No retractions. Lungs CTAB. Abdominal: Soft and she has moderate lower abdominal discomfort. No distention. No guarding no rebound obesity noted Back:  There is no focal tenderness or step off.  there is no midline tenderness there are no lesions noted. there is no CVA tenderness Musculoskeletal: No lower extremity tenderness, no upper extremity tenderness. No joint effusions, no DVT signs  strong distal pulses no edema Neurologic:  Normal speech and language. No gross focal neurologic deficits are appreciated.  Skin:  Skin is warm, dry and intact. No rash noted. Psychiatric: Mood and affect are anxious. Speech and behavior are normal.  ____________________________________________   LABS (all labs ordered are listed, but only abnormal results are displayed)  Labs Reviewed  COMPREHENSIVE METABOLIC PANEL - Abnormal; Notable for the following components:      Result Value   Sodium 134 (*)    CO2 21 (*)    Glucose, Bld 111 (*)    BUN 34 (*)    Creatinine, Ser 3.01 (*)    Calcium 8.4 (*)    GFR calc non Af Amer 16 (*)    GFR calc Af Amer 19 (*)    All other components within normal limits  CBC - Abnormal; Notable for the following components:   WBC 16.0 (*)    Hemoglobin 11.6 (*)    RDW 15.6 (*)    All other components within normal limits  LIPASE, BLOOD  URINALYSIS, COMPLETE (UACMP) WITH MICROSCOPIC    Pertinent labs  results that were available during my care of the patient were reviewed by me and considered in my medical decision making  (see chart for details). ____________________________________________  EKG  I personally interpreted any EKGs ordered by me or triage Sinus rate 90, no acute ST elevation or depression normal axis LAD noted.  No acute ischemia ____________________________________________  RADIOLOGY  Pertinent labs & imaging results that were available during my care of the patient were reviewed by me and considered in my medical decision making (see chart for details). If possible, patient and/or family made aware of any abnormal findings.  Ct Abdomen Pelvis Wo Contrast  Result Date: 06/10/2018 CLINICAL DATA:  Fatigue. Abdominal pain. Symptoms for 30 days. Nausea and vomiting. Frequent bowel movements. Left lower quadrant pain. EXAM: CT ABDOMEN AND PELVIS WITHOUT CONTRAST TECHNIQUE: Multidetector CT imaging of the abdomen and pelvis was performed following the standard protocol without IV contrast. COMPARISON:  12/15/2008 FINDINGS: Lower chest: Clear lung bases. Normal heart size without pericardial or pleural effusion. Hepatobiliary: Moderate hepatic steatosis. Normal liver. Cholecystectomy, without biliary ductal dilatation. Pancreas: Normal, without mass or ductal dilatation. Spleen: Normal in size, without focal abnormality. Adrenals/Urinary Tract: Normal adrenal glands. No renal calculi or hydronephrosis. The ureters are difficult to follow. No bladder calculi. Stomach/Bowel: Normal stomach, without wall thickening. Scattered colonic diverticula. Normal terminal ileum. Normal small bowel. Vascular/Lymphatic: Aortic and branch vessel atherosclerosis. No abdominopelvic adenopathy. Reproductive: Hysterectomy.  No adnexal mass. Other: No significant free fluid. Right abdominal wall surgical sutures. Musculoskeletal: Lumbosacral spondylosis. IMPRESSION: 1. No acute process in the abdomen or pelvis. No explanation for patient's symptoms. 2. Hepatic steatosis. 3.  Aortic Atherosclerosis (ICD10-I70.0). Electronically  Signed   By: Abigail Miyamoto M.D.   On: 06/10/2018 16:34   ____________________________________________    PROCEDURES  Procedure(s) performed: None  Procedures  Critical Care performed: CRITICAL CARE Performed by: Schuyler Amor   Total critical care time: 38 minutes  Critical care time was exclusive of separately billable procedures and treating other patients.  Critical care was necessary to treat or prevent imminent or life-threatening deterioration.  Critical care was time spent personally by me on the following activities: development of treatment plan with patient and/or surrogate as well as nursing, discussions with consultants, evaluation of patient's response to treatment, examination of patient, obtaining history from patient or surrogate, ordering and performing treatments and interventions, ordering  and review of laboratory studies, ordering and review of radiographic studies, pulse oximetry and re-evaluation of patient's condition.   ____________________________________________   INITIAL IMPRESSION / ASSESSMENT AND PLAN / ED COURSE  Pertinent labs & imaging results that were available during my care of the patient were reviewed by me and considered in my medical decision making (see chart for details).  Patient here in the emergency room because she is had abdominal pain for a month, she does not have a surgical abdomen at this time and her CT scan is negative white count is elevated as it has been in the past, also, is noted to have acute kidney injury.  The no time did she have any dissection symptoms and again CT scan to the extent that a CT scan without contrast can be obtained, shows no acute pathology in the abdomen pelvis which is certainly reassuring.  Unclear exactly why she has been having abdominal pain for the last month but it is likely that her abdominal pain is led to decreased p.o. and therefore acute renal injury.  We will give her IV fluids, pain  medications nausea medications, check a lactic acid and an EKG to see what her risk factors are for ischemia although I do not think a CTA at this time is indicated given the month of symptoms, I think likely to be better to hydrate her and get her kidney function better prior to doing a contrast load.  Patient will need to be admitted for further evaluation likely by GI.  We will talk to the hospitalist service.  ----------------------------------------- 7:06 PM on 06/10/2018 -----------------------------------------  Lactic acid is borderline, but certainly not elevated in the way that one would anticipate if she were having ischemic gut for a month.  We are giving her IV fluid we have yet to get a urine sample from her although there is no evidence of urinary retention on CT scan.  VBG is pending at admission.  ----------------------------------------- 7:31 PM on 06/10/2018 -----------------------------------------  Patient states she has not been anuric at home she has not yet given [place] a urine sample despite IV fluid, we will do an in and out catheterization given her white count borderline lactic acid.  She has been here for 3 hours, states she urinated just before she came in.    ____________________________________________   FINAL CLINICAL IMPRESSION(S) / ED DIAGNOSES  Final diagnoses:  None      This chart was dictated using voice recognition software.  Despite best efforts to proofread,  errors can occur which can change meaning.      Schuyler Amor, MD 06/10/18 1748    Schuyler Amor, MD 06/10/18 1900    Schuyler Amor, MD 06/10/18 Pauline Aus    Schuyler Amor, MD 06/10/18 [phone]

## 2018-06-10 NOTE — H&P (Addendum)
[facility] [place] NAME: Claudia Hunter    MR#:  [id number]  DATE OF BIRTH:  [birth year]  DATE OF ADMISSION:  06/10/2018  PRIMARY CARE PHYSICIAN: Rusty Aus, MD   REQUESTING/REFERRING PHYSICIAN: Charlotte Crumb, MD  CHIEF COMPLAINT:   Chief Complaint  Patient presents with  . Abdominal Pain  . Abnormal Lab    HISTORY OF PRESENT ILLNESS:  Claudia Hunter  is a 63 y.o. female with a known history of hypertension and type 2 diabetes who was sent to the ED from her PCPs office due to acute kidney injury on today's labs.  She was seen in her PCPs office for generalized abdominal pain over the last month.  The pain is mostly located across her lower abdomen and her epigastric area.  She describes the pain as "crampy, stabbing, and sharp".  She has had coffee-ground emesis x 2, black stool, and bright red blood per rectum over the last couple of days.  She states she has not been drinking and eating as well due to decreased appetite and nausea.  She denies any NSAID use.  She endorses fatigue.  She denies any fevers, chills, chest pain, shortness of breath.  In the ED, vitals were unremarkable.  Labs were significant for creatinine 3.01, lactic acid 2.1, WBC 16, hemoglobin 11.6.  UA was unremarkable.  CT abdomen pelvis that was performed as an outpatient today showed no acute process.  Hospitalists were called for admission.  PAST MEDICAL HISTORY:   Past Medical History:  Diagnosis Date  . Diabetes mellitus without complication ([place])   . Diverticulitis   . Gout   . Hypertension   . Neuropathy     PAST SURGICAL HISTORY:   Past Surgical History:  Procedure Laterality Date  . ABDOMINAL HYSTERECTOMY    . CESAREAN SECTION    . CHOLECYSTECTOMY      SOCIAL HISTORY:   Social History   Tobacco Use  . Smoking status: Never Smoker  . Smokeless tobacco: Never Used  Substance Use Topics  . Alcohol use: No    FAMILY HISTORY:   Family  History  Problem Relation Age of Onset  . Cancer Mother   . Heart disease Father   . Breast cancer Maternal Aunt        great    DRUG ALLERGIES:   Allergies  Allergen Reactions  . Asa [Aspirin] Nausea And Vomiting    Other reaction(s): Other (See Comments) Tremors, vertigo dizziness   . Celecoxib     Other reaction(s): Dizziness  . Clemastine     Other reaction(s): Unknown  . Limonene     Other reaction(s): Nausea And Vomiting dizziness  . Lovastatin Other (See Comments)    Made her feel like she is in trance/suicidal   . Oxycodone-Acetaminophen Other (See Comments)  . Percocet [Oxycodone-Acetaminophen]     Other reaction(s): Hallucination hallucinations    REVIEW OF SYSTEMS:   Review of Systems  Constitutional: Negative for chills and fever.  HENT: Negative for congestion and sore throat.   Eyes: Negative for blurred vision and double vision.  Respiratory: Negative for cough and shortness of breath.   Cardiovascular: Negative for chest pain and palpitations.  Gastrointestinal: Positive for abdominal pain, blood in stool, constipation, melena, nausea and vomiting.  Genitourinary: Negative for dysuria and urgency.  Musculoskeletal: Negative for back pain and neck pain.  Neurological: Negative for dizziness and headaches.  Psychiatric/Behavioral: Negative for depression. The patient is not  nervous/anxious.     MEDICATIONS AT HOME:   Prior to Admission medications   Medication Sig Start Date End Date Taking? Authorizing Provider  allopurinol (ZYLOPRIM) 300 MG tablet Take 300 mg by mouth daily.   Yes [provider]  atenolol (TENORMIN) 50 MG tablet Take 50 mg by mouth daily.   Yes [provider]  BIOTIN PO Take 1 tablet by mouth daily.   Yes [provider]  Cholecalciferol (VITAMIN D-1000 MAX ST) 1000 units tablet Take by mouth.   Yes [provider]  Cyanocobalamin (RA VITAMIN B-12 TR) 1000 MCG TBCR Take by mouth. Reported on  05/24/2015   Yes [provider]  cyclobenzaprine (FLEXERIL) 10 MG tablet take 1 tablet by mouth twice a day for MUSCLES 04/15/15  Yes [provider]  Ferrous Gluconate (FERGON PO) Take 2 tablets by mouth daily.   Yes [provider]  furosemide (LASIX) 20 MG tablet Take 20 mg by mouth daily. 02/24/18  Yes [provider]  gabapentin (NEURONTIN) 100 MG capsule Take 200 mg by mouth 2 (two) times daily. 05/09/18  Yes [provider]  Iron-Vitamin C (VITRON-C) 65-125 MG TABS Take 1 tablet by mouth daily.   Yes [provider]  levothyroxine (SYNTHROID) 150 MCG tablet Take 150 mcg by mouth daily. 08/07/17 08/07/18 Yes [provider]  magnesium oxide (MAG-OX) 400 MG tablet Take 400 mg by mouth daily.   Yes [provider]  metFORMIN (GLUCOPHAGE) 500 MG tablet Take 500 mg by mouth 2 (two) times daily with a meal.   Yes [provider]  pantoprazole (PROTONIX) 40 MG tablet Take 40 mg by mouth daily.   Yes [provider]  pravastatin (PRAVACHOL) 20 MG tablet Take 20 mg by mouth Nightly. 02/18/18 02/18/19 Yes [provider]  telmisartan-hydrochlorothiazide (MICARDIS HCT) 40-12.5 MG tablet Take 1 tablet by mouth daily. 03/20/18  Yes [provider]  traMADol (ULTRAM) 50 MG tablet Take 50 mg by mouth every 6 (six) hours as needed for moderate pain.   Yes [provider]  zolpidem (AMBIEN) 10 MG tablet Take 10 mg by mouth at bedtime.  11/12/13  Yes [provider]  naproxen (NAPROSYN) 500 MG tablet Take 1 tablet (500 mg total) by mouth 2 (two) times daily. Patient not taking: Reported on 06/10/2018 09/20/13   Jola Schmidt, MD      VITAL SIGNS:  Blood pressure 109/73, pulse 88, temperature 98.6 F (37 C), temperature source Oral, resp. rate 13, height 5\' 6"  (1.676 m), weight 116 kg, SpO2 100 %.  PHYSICAL EXAMINATION:  Physical Exam  GENERAL:  63 y.o.-year-old patient lying in the bed with no  acute distress.  EYES: Pupils equal, round, reactive to light and accommodation. No scleral icterus. Extraocular muscles intact.  HEENT: Head atraumatic, normocephalic. Oropharynx and nasopharynx clear.  NECK:  Supple, no jugular venous distention. No thyroid enlargement, no tenderness.  LUNGS: Normal breath sounds bilaterally, no wheezing, rales,rhonchi or crepitation. No use of accessory muscles of respiration.  CARDIOVASCULAR: RRR, S1, S2 normal. No murmurs, rubs, or gallops.  ABDOMEN: Soft, nondistended. Bowel sounds present. No organomegaly or mass. +epigastric pain, +RLQ and periumbilical pain. No rebound or guarding. EXTREMITIES: No pedal edema, cyanosis, or clubbing.  NEUROLOGIC: Cranial nerves II through XII are intact. Muscle strength 5/5 in all extremities. Sensation intact. Gait not checked.  PSYCHIATRIC: The patient is alert and oriented x 3.  SKIN: No obvious rash, lesion, or ulcer.   LABORATORY PANEL:  CBC Recent Labs  Lab 06/10/18 1645  WBC 16.0*  HGB 11.6*  HCT 37.2  PLT 393   ------------------------------------------------------------------------------------------------------------------  Chemistries  Recent Labs  Lab 06/10/18 1645  NA 134*  K 3.9  CL 99  CO2 21*  GLUCOSE 111*  BUN 34*  CREATININE 3.01*  CALCIUM 8.4*  AST 16  ALT 14  ALKPHOS 96  BILITOT 0.7   ------------------------------------------------------------------------------------------------------------------  Cardiac Enzymes Recent Labs  Lab 06/10/18 1645  TROPONINI <0.03   ------------------------------------------------------------------------------------------------------------------  RADIOLOGY:  Ct Abdomen Pelvis Wo Contrast  Result Date: 06/10/2018 CLINICAL DATA:  Fatigue. Abdominal pain. Symptoms for 30 days. Nausea and vomiting. Frequent bowel movements. Left lower quadrant pain. EXAM: CT ABDOMEN AND PELVIS WITHOUT CONTRAST TECHNIQUE: Multidetector CT imaging of the  abdomen and pelvis was performed following the standard protocol without IV contrast. COMPARISON:  12/15/2008 FINDINGS: Lower chest: Clear lung bases. Normal heart size without pericardial or pleural effusion. Hepatobiliary: Moderate hepatic steatosis. Normal liver. Cholecystectomy, without biliary ductal dilatation. Pancreas: Normal, without mass or ductal dilatation. Spleen: Normal in size, without focal abnormality. Adrenals/Urinary Tract: Normal adrenal glands. No renal calculi or hydronephrosis. The ureters are difficult to follow. No bladder calculi. Stomach/Bowel: Normal stomach, without wall thickening. Scattered colonic diverticula. Normal terminal ileum. Normal small bowel. Vascular/Lymphatic: Aortic and branch vessel atherosclerosis. No abdominopelvic adenopathy. Reproductive: Hysterectomy.  No adnexal mass. Other: No significant free fluid. Right abdominal wall surgical sutures. Musculoskeletal: Lumbosacral spondylosis. IMPRESSION: 1. No acute process in the abdomen or pelvis. No explanation for patient's symptoms. 2. Hepatic steatosis. 3.  Aortic Atherosclerosis (ICD10-I70.0). Electronically Signed   By: Abigail Miyamoto M.D.   On: 06/10/2018 16:34      IMPRESSION AND PLAN:   Acute kidney injury- likely due to dehydration in the setting of vomiting, in addition to Lasix and telmisartan-hydrochlorothiazide.  No NSAID use.  UA negative. -IV fluids -Renal ultrasound -Holding home Lasix and telmisartan-hydrochlorothiazide -Recheck creatinine in the morning  Generalized abdominal pain/coffee-ground emesis/melena- CT abdomen/pelvis negative for acute process.  May have gastritis vs ulcer. Hgb at baseline. -Gastroenterology consult -IV PPI -Check H. Pylori labs -Clear liquid diet  Leukocytosis- unclear etiology. Patient denies any infectious symptoms. UA negative. -Check CXR -Urine culture pending  Hypertension-blood pressures normal in the ED -Continue atenolol -Holding home  telmisartan-hydrochlorothiazide -Hydralazine IV prn  Hypothyroidism-stable -Continue home Synthroid  Hyperlipidemia-stable -Continue home pravastatin  DVT prophylaxis- SCDs  All the records are reviewed and case discussed with ED provider. Management plans discussed with the patient, family and they are in agreement.  CODE STATUS: Full  TOTAL TIME TAKING CARE OF THIS PATIENT: 45 minutes.    Berna Spare Vinh Sachs M.D on 06/10/2018 at 9:05 PM  Between 7am to 6pm - Pager - [phone]  After 6pm go to [email or web address] - Proofreader  [facility]  Office  [phone]  CC: Primary care physician; Rusty Aus, MD   Note: This dictation was prepared with Dragon dictation along with smaller phrase technology. Any transcriptional errors that result from this process are unintentional.

## 2018-06-10 NOTE — ED Triage Notes (Signed)
Pt was seeing PCP today for LLQ abd pain. Sent for Abdominal CT, labs were drawn prior and noted that creatinine  was 2.8. Pt did have non contrasted ct completed.

## 2018-06-11 ENCOUNTER — Inpatient Hospital Stay: Payer: 59

## 2018-06-11 DIAGNOSIS — K625 Hemorrhage of anus and rectum: Secondary | ICD-10-CM | POA: Diagnosis not present

## 2018-06-11 DIAGNOSIS — K921 Melena: Secondary | ICD-10-CM

## 2018-06-11 DIAGNOSIS — I10 Essential (primary) hypertension: Secondary | ICD-10-CM | POA: Diagnosis not present

## 2018-06-11 DIAGNOSIS — R109 Unspecified abdominal pain: Secondary | ICD-10-CM

## 2018-06-11 DIAGNOSIS — R1084 Generalized abdominal pain: Secondary | ICD-10-CM | POA: Diagnosis not present

## 2018-06-11 DIAGNOSIS — N179 Acute kidney failure, unspecified: Secondary | ICD-10-CM | POA: Diagnosis not present

## 2018-06-11 DIAGNOSIS — D72829 Elevated white blood cell count, unspecified: Secondary | ICD-10-CM

## 2018-06-11 DIAGNOSIS — K920 Hematemesis: Secondary | ICD-10-CM

## 2018-06-11 LAB — URINE CULTURE: Culture: NO GROWTH

## 2018-06-11 LAB — BASIC METABOLIC PANEL
Anion gap: 12 (ref 5–15)
BUN: 32 mg/dL — ABNORMAL HIGH (ref 6–20)
CO2: 21 mmol/L — ABNORMAL LOW (ref 22–32)
Calcium: 8.1 mg/dL — ABNORMAL LOW (ref 8.9–10.3)
Chloride: 106 mmol/L (ref 98–111)
Creatinine, Ser: 2.58 mg/dL — ABNORMAL HIGH (ref 0.44–1.00)
GFR calc Af Amer: 23 mL/min — ABNORMAL LOW (ref >60)
GFR calc non Af Amer: 19 mL/min — ABNORMAL LOW (ref >60)
Glucose, Bld: 94 mg/dL (ref 70–99)
Potassium: 3.9 mmol/L (ref 3.5–5.1)
Sodium: 139 mmol/L (ref 135–145)

## 2018-06-11 LAB — CBC
HCT: 33.4 % — ABNORMAL LOW (ref 36.0–46.0)
Hemoglobin: 10.3 g/dL — ABNORMAL LOW (ref 12.0–15.0)
MCH: 26.7 pg (ref 26.0–34.0)
MCHC: 30.8 g/dL (ref 30.0–36.0)
MCV: 86.5 fL (ref 80.0–100.0)
Platelets: 351 10*3/uL (ref 150–400)
RBC: 3.86 MIL/uL — ABNORMAL LOW (ref 3.87–5.11)
RDW: 15.5 % (ref 11.5–15.5)
WBC: 12.3 10*3/uL — ABNORMAL HIGH (ref 4.0–10.5)
nRBC: 0 % (ref 0.0–0.2)

## 2018-06-11 LAB — IRON AND TIBC
Iron: 35 ug/dL (ref 28–170)
Saturation Ratios: 14 % (ref 10.4–31.8)
TIBC: 255 ug/dL (ref 250–450)
UIBC: 220 ug/dL

## 2018-06-11 LAB — GLUCOSE, CAPILLARY
Glucose-Capillary: 102 mg/dL — ABNORMAL HIGH (ref 70–99)
Glucose-Capillary: 118 mg/dL — ABNORMAL HIGH (ref 70–99)

## 2018-06-11 LAB — FOLATE: Folate: 15.1 ng/mL (ref >5.9)

## 2018-06-11 LAB — FERRITIN: Ferritin: 200 ng/mL (ref 11–307)

## 2018-06-11 LAB — VITAMIN B12: Vitamin B-12: 1527 pg/mL — ABNORMAL HIGH (ref 180–914)

## 2018-06-11 MED ORDER — TRAMADOL HCL 50 MG PO TABS
50.0000 mg | ORAL_TABLET | Freq: Four times a day (QID) | ORAL | Status: DC | PRN
  Administered 2018-06-11 – 2018-06-12 (×2): 50 mg via ORAL
  Filled 2018-06-11 (×2): qty 1

## 2018-06-11 MED ORDER — PEG 3350-KCL-NA BICARB-NACL 420 G PO SOLR
4000.0000 mL | Freq: Once | ORAL | Status: AC
  Administered 2018-06-11: 4000 mL via ORAL
  Filled 2018-06-11: qty 4000

## 2018-06-11 MED ORDER — METRONIDAZOLE IN NACL 5-0.79 MG/ML-% IV SOLN
500.0000 mg | Freq: Three times a day (TID) | INTRAVENOUS | Status: DC
  Administered 2018-06-11 – 2018-06-12 (×4): 500 mg via INTRAVENOUS
  Filled 2018-06-11 (×7): qty 100

## 2018-06-11 MED ORDER — CIPROFLOXACIN IN D5W 400 MG/200ML IV SOLN
400.0000 mg | Freq: Two times a day (BID) | INTRAVENOUS | Status: DC
  Administered 2018-06-11 (×2): 400 mg via INTRAVENOUS
  Filled 2018-06-11 (×5): qty 200

## 2018-06-11 NOTE — Progress Notes (Signed)
[facility] [facility] [place] NAME: Claudia Hunter    MR#:  [id number]  DATE OF BIRTH:  [birth year]  SUBJECTIVE:  Here with AKI and abdominal pain Denies abd pain this am Also with melena pat few days no NSAIDS  REVIEW OF SYSTEMS:    Review of Systems  Constitutional: Negative for fever, chills weight loss HENT: Negative for ear pain, nosebleeds, congestion, facial swelling, rhinorrhea, neck pain, neck stiffness and ear discharge.   Respiratory: Negative for cough, shortness of breath, wheezing  Cardiovascular: Negative for chest pain, palpitations and leg swelling.  Gastrointestinal: Negative for heartburn, abdominal pain, vomiting, diarrhea or consitpation Genitourinary: Negative for dysuria, urgency, frequency, hematuria Musculoskeletal: Negative for back pain or joint pain Neurological: Negative for dizziness, seizures, syncope, focal weakness,  numbness and headaches.  Hematological: Does not bruise/bleed easily.  Psychiatric/Behavioral: Negative for hallucinations, confusion, dysphoric mood    Tolerating Diet: yes      DRUG ALLERGIES:   Allergies  Allergen Reactions  . Asa [Aspirin] Nausea And Vomiting    Other reaction(s): Other (See Comments) Tremors, vertigo dizziness   . Celecoxib     Other reaction(s): Dizziness  . Clemastine     Other reaction(s): Unknown  . Limonene     Other reaction(s): Nausea And Vomiting dizziness  . Lovastatin Other (See Comments)    Made her feel like she is in trance/suicidal   . Oxycodone-Acetaminophen Other (See Comments)  . Percocet [Oxycodone-Acetaminophen]     Other reaction(s): Hallucination hallucinations    VITALS:  Blood pressure 115/72, pulse 88, temperature 98.6 F (37 C), temperature source Oral, resp. rate 19, height 5\' 6"  (1.676 m), weight 116 kg, SpO2 97 %.  PHYSICAL EXAMINATION:  Constitutional: Appears well-developed and well-nourished. No distress. HENT: Normocephalic. Marland Kitchen  Oropharynx is clear and moist.  Eyes: Conjunctivae and EOM are normal. PERRLA, no scleral icterus.  Neck: Normal ROM. Neck supple. No JVD. No tracheal deviation. CVS: RRR, S1/S2 +, no murmurs, no gallops, no carotid bruit.  Pulmonary: Effort and breath sounds normal, no stridor, rhonchi, wheezes, rales.  Abdominal: Soft. BS +,  no distension, tenderness, rebound or guarding.  Musculoskeletal: Normal range of motion. No edema and no tenderness.  Neuro: Alert. CN 2-12 grossly intact. No focal deficits. Skin: Skin is warm and dry. No rash noted. Psychiatric: Normal mood and affect.      LABORATORY PANEL:   CBC Recent Labs  Lab 06/11/18 0258  WBC 12.3*  HGB 10.3*  HCT 33.4*  PLT 351   ------------------------------------------------------------------------------------------------------------------  Chemistries  Recent Labs  Lab 06/10/18 1645 06/11/18 0258  NA 134* 139  K 3.9 3.9  CL 99 106  CO2 21* 21*  GLUCOSE 111* 94  BUN 34* 32*  CREATININE 3.01* 2.58*  CALCIUM 8.4* 8.1*  AST 16  --   ALT 14  --   ALKPHOS 96  --   BILITOT 0.7  --    ------------------------------------------------------------------------------------------------------------------  Cardiac Enzymes Recent Labs  Lab 06/10/18 1645  TROPONINI <0.03   ------------------------------------------------------------------------------------------------------------------  RADIOLOGY:  Ct Abdomen Pelvis Wo Contrast  Result Date: 06/10/2018 CLINICAL DATA:  Fatigue. Abdominal pain. Symptoms for 30 days. Nausea and vomiting. Frequent bowel movements. Left lower quadrant pain. EXAM: CT ABDOMEN AND PELVIS WITHOUT CONTRAST TECHNIQUE: Multidetector CT imaging of the abdomen and pelvis was performed following the standard protocol without IV contrast. COMPARISON:  12/15/2008 FINDINGS: Lower chest: Clear lung bases. Normal heart size without pericardial or pleural effusion. Hepatobiliary: Moderate hepatic steatosis.  Normal liver. Cholecystectomy, without biliary ductal dilatation. Pancreas: Normal, without mass or ductal dilatation. Spleen: Normal in size, without focal abnormality. Adrenals/Urinary Tract: Normal adrenal glands. No renal calculi or hydronephrosis. The ureters are difficult to follow. No bladder calculi. Stomach/Bowel: Normal stomach, without wall thickening. Scattered colonic diverticula. Normal terminal ileum. Normal small bowel. Vascular/Lymphatic: Aortic and branch vessel atherosclerosis. No abdominopelvic adenopathy. Reproductive: Hysterectomy.  No adnexal mass. Other: No significant free fluid. Right abdominal wall surgical sutures. Musculoskeletal: Lumbosacral spondylosis. IMPRESSION: 1. No acute process in the abdomen or pelvis. No explanation for patient's symptoms. 2. Hepatic steatosis. 3.  Aortic Atherosclerosis (ICD10-I70.0). Electronically Signed   By: Abigail Miyamoto M.D.   On: 06/10/2018 16:34   Dg Chest 1 View  Result Date: 06/10/2018 CLINICAL DATA:  63 year old female with leukocytosis, abdominal pain. EXAM: CHEST  1 VIEW COMPARISON:  CT Abdomen and Pelvis earlier today. Portable chest 05/12/2018. FINDINGS: Portable AP upright view at 2249 hours. Lung volumes and mediastinal contours are within normal limits. Visualized tracheal air column is within normal limits. Allowing for portable technique the lungs are clear. No pneumothorax or pneumoperitoneum identified. No acute osseous abnormality identified. IMPRESSION: Negative portable chest. Electronically Signed   By: Genevie Ann M.D.   On: 06/10/2018 23:09   US Renal  Result Date: 06/11/2018 CLINICAL DATA:  Acute renal failure EXAM: RENAL / URINARY TRACT ULTRASOUND COMPLETE COMPARISON:  CT abdomen pelvis 06/10/2018 FINDINGS: Right Kidney: Renal measurements: 11.3 x 4.9 x 5.2 cm = volume: 148.6 mL. Increased renal cortical echogenicity. No mass or hydronephrosis visualized. Left Kidney: Renal measurements: 10.1 x 5.8 x 4.1 cm = volume: 127.1 mL.  Increased renal cortical echogenicity. No mass or hydronephrosis visualized. Within the midpole of the left kidney there is a 2.4 x 2.6 x 1.7 cm cystic lesion. Bladder: Appears normal for degree of bladder distention. IMPRESSION: No hydronephrosis. Increased renal cortical echogenicity as can be seen with chronic medical renal disease. Electronically Signed   By: Lovey Newcomer M.D.   On: 06/11/2018 11:02     ASSESSMENT AND PLAN:   63 year old female with hypertension and diabetes who was sent from PCP office due to acute kidney injury seen on labs.  1.  Acute kidney injury: This is due to poor p.o. intake.  Creatinine is slowly improving. Renal ultrasound showed no hydronephrosis Continue IV fluids. 2.  Generalized abdominal pain with melena: Plan for GI consultation and possible EGD. CT the abdomen showed no acute pathology. Follow-up on CT renal stone Continue PPI Follow Hgb.   3.  Diabetes: Continue ADA diet with sliding scale  4.  Essential hypertension: Continue atenolol  5.  Hypothyroidism: Continue Synthroid  6  Hyperlipidemia: Continue pravastatin   7. Anemia of chronic disease: Iron panel consistent with chronic disease 8.  Hyponatremia from hypovolemia which is improved with IV fluids  Management plans discussed with the patient and she is in agreement.  CODE STATUS: full  TOTAL TIME TAKING CARE OF THIS PATIENT: 30 minutes.     POSSIBLE D/C 1-2 days , DEPENDING ON CLINICAL CONDITION.   Bettey Costa M.D on 06/11/2018 at 11:23 AM  Between 7am to 6pm - Pager - [phone] After 6pm go to [email or web address] - password [email or web address] [facility]  Office  [phone]  CC: Primary care physician; Rusty Aus, MD  Note: This dictation was prepared with Dragon dictation along with smaller phrase technology. Any transcriptional errors that result from this process are unintentional.

## 2018-06-11 NOTE — Consult Note (Signed)
Cephas Darby, MD [address]  [place]  [place], [place] [address]  Main: [phone]  Fax: [phone] Pager: [phone]   Consultation  Referring Provider:     No ref. provider found Primary Care Physician:  Rusty Aus, MD Primary Gastroenterologist:   None      Reason for Consultation:     Coffee-ground emesis, rectal bleeding, abdominal pain  Date of Admission:  06/10/2018 Date of Consultation:  06/11/2018         HPI:   Claudia Hunter is a 63 y.o. female with history of diabetes, obesity, chronic GERD presents with 2 days history of abdominal pain associated with dark red bloody stool as well as coffee-ground emesis.  She reports abdominal pain is generalized.  In the ER, lipase, LFTs normal, had leukocytosis, underwent CT abdomen and pelvis without contrast which did not reveal any acute pathology.  Apparently, patient reports that she has been feeling tired and has been experiencing brown mushy bowel movements with generalized abdominal pain for the last 4 weeks.  She reports nausea.  She denies fever, chills, sick contacts, recent travel She denies having any bowel movement today She denies tobacco use, drinking alcohol She has been taking Protonix for last several years for reflux NSAIDs: None  Antiplts/Anticoagulants/Anti thrombotics: None  GI Procedures: Had a colonoscopy about 18 to 20 years ago She denies family history of GI malignancy She had hysterectomy  Past Medical History:  Diagnosis Date   Diabetes mellitus without complication ([place])    Diverticulitis    Gout    Hypertension    Neuropathy     Past Surgical History:  Procedure Laterality Date   ABDOMINAL HYSTERECTOMY     CESAREAN SECTION     CHOLECYSTECTOMY      Prior to Admission medications   Medication Sig Start Date End Date Taking? Authorizing Provider  allopurinol (ZYLOPRIM) 300 MG tablet Take 300 mg by mouth daily.   Yes [provider]  atenolol  (TENORMIN) 50 MG tablet Take 50 mg by mouth daily.   Yes [provider]  BIOTIN PO Take 1 tablet by mouth daily.   Yes [provider]  Cholecalciferol (VITAMIN D-1000 MAX ST) 1000 units tablet Take by mouth.   Yes [provider]  Cyanocobalamin (RA VITAMIN B-12 TR) 1000 MCG TBCR Take by mouth. Reported on 05/24/2015   Yes [provider]  cyclobenzaprine (FLEXERIL) 10 MG tablet take 1 tablet by mouth twice a day for MUSCLES 04/15/15  Yes [provider]  Ferrous Gluconate (FERGON PO) Take 2 tablets by mouth daily.   Yes [provider]  furosemide (LASIX) 20 MG tablet Take 20 mg by mouth daily. 02/24/18  Yes [provider]  gabapentin (NEURONTIN) 100 MG capsule Take 200 mg by mouth 2 (two) times daily. 05/09/18  Yes [provider]  Iron-Vitamin C (VITRON-C) 65-125 MG TABS Take 1 tablet by mouth daily.   Yes [provider]  levothyroxine (SYNTHROID) 150 MCG tablet Take 150 mcg by mouth daily. 08/07/17 08/07/18 Yes [provider]  magnesium oxide (MAG-OX) 400 MG tablet Take 400 mg by mouth daily.   Yes [provider]  metFORMIN (GLUCOPHAGE) 500 MG tablet Take 500 mg by mouth 2 (two) times daily with a meal.   Yes [provider]  pantoprazole (PROTONIX) 40 MG tablet Take 40 mg by mouth daily.   Yes [provider]  pravastatin (PRAVACHOL) 20 MG tablet Take 20  mg by mouth Nightly. 02/18/18 02/18/19 Yes [provider]  telmisartan-hydrochlorothiazide (MICARDIS HCT) 40-12.5 MG tablet Take 1 tablet by mouth daily. 03/20/18  Yes [provider]  traMADol (ULTRAM) 50 MG tablet Take 50 mg by mouth every 6 (six) hours as needed for moderate pain.   Yes [provider]  zolpidem (AMBIEN) 10 MG tablet Take 10 mg by mouth at bedtime.  11/12/13  Yes [provider]  naproxen (NAPROSYN) 500 MG tablet Take 1 tablet (500 mg total) by mouth 2 (two) times daily. Patient  not taking: Reported on 06/10/2018 09/20/13   Jola Schmidt, MD   Current Facility-Administered Medications:    0.9 %  sodium chloride infusion, , Intravenous, Continuous, Mayo, Pete Pelt, MD, Last Rate: 75 mL/hr at 06/11/18 1103   acetaminophen (TYLENOL) tablet 650 mg, 650 mg, Oral, Q6H PRN **OR** acetaminophen (TYLENOL) suppository 650 mg, 650 mg, Rectal, Q6H PRN, Mayo, Pete Pelt, MD   allopurinol (ZYLOPRIM) tablet 300 mg, 300 mg, Oral, Daily, Mayo, Pete Pelt, MD, 300 mg at 06/11/18 1101   atenolol (TENORMIN) tablet 50 mg, 50 mg, Oral, Daily, Mayo, Pete Pelt, MD, 50 mg at 06/11/18 1102   ciprofloxacin (CIPRO) IVPB 400 mg, 400 mg, Intravenous, Q12H, Makaley Storts, Tally Due, MD, Stopped at 06/11/18 1519   cyclobenzaprine (FLEXERIL) tablet 10 mg, 10 mg, Oral, TID PRN, Mayo, Pete Pelt, MD   gabapentin (NEURONTIN) capsule 100 mg, 100 mg, Oral, BID, Mayo, Pete Pelt, MD, 100 mg at 06/11/18 1102   hydrALAZINE (APRESOLINE) injection 5 mg, 5 mg, Intravenous, Q4H PRN, Mayo, Pete Pelt, MD   insulin aspart (novoLOG) injection 0-5 Units, 0-5 Units, Subcutaneous, QHS, Mayo, Pete Pelt, MD   insulin aspart (novoLOG) injection 0-9 Units, 0-9 Units, Subcutaneous, TID WC, Mayo, Pete Pelt, MD   levothyroxine (SYNTHROID) tablet 150 mcg, 150 mcg, Oral, QAC breakfast, Mayo, Pete Pelt, MD, 150 mcg at 06/11/18 6659   metroNIDAZOLE (FLAGYL) IVPB 500 mg, 500 mg, Intravenous, Q8H, Jarreau Callanan, Tally Due, MD, Last Rate: 100 mL/hr at 06/11/18 1525, 500 mg at 06/11/18 1525   ondansetron (ZOFRAN) tablet 4 mg, 4 mg, Oral, Q6H PRN **OR** ondansetron (ZOFRAN) injection 4 mg, 4 mg, Intravenous, Q6H PRN, Mayo, Pete Pelt, MD   pantoprazole (PROTONIX) injection 40 mg, 40 mg, Intravenous, Q12H, Mayo, Pete Pelt, MD, 40 mg at 06/11/18 1102   polyethylene glycol (MIRALAX / GLYCOLAX) packet 17 g, 17 g, Oral, Daily PRN, Mayo, Pete Pelt, MD   polyethylene glycol-electrolytes (NuLYTELY/GoLYTELY) solution 4,000 mL, 4,000 mL, Oral, Once,  Roshana Shuffield, Tally Due, MD   pravastatin (PRAVACHOL) tablet 20 mg, 20 mg, Oral, Nightly, Mayo, Pete Pelt, MD, 20 mg at 06/10/18 2336   traMADol (ULTRAM) tablet 50 mg, 50 mg, Oral, Q6H PRN, Benjie Karvonen, Sital, MD   zolpidem (AMBIEN) tablet 5 mg, 5 mg, Oral, QHS, Mayo, Pete Pelt, MD, 5 mg at 06/10/18 2336   Family History  Problem Relation Age of Onset   Cancer Mother    Heart disease Father    Breast cancer Maternal Aunt        great     Social History   Tobacco Use   Smoking status: Never Smoker   Smokeless tobacco: Never Used  Substance Use Topics   Alcohol use: No   Drug use: Not on file    Allergies as of 06/10/2018 - Review Complete 06/10/2018  Allergen Reaction Noted   Asa [aspirin] Nausea And Vomiting 09/20/2013   Celecoxib  05/03/2015   Clemastine  05/03/2015   Limonene  05/03/2015   Lovastatin Other (See Comments) 02/18/2018   Oxycodone-acetaminophen Other (See Comments) 07/15/2013   Percocet [oxycodone-acetaminophen]  09/20/2013    Review of Systems:    All systems reviewed and negative except where noted in HPI.   Physical Exam:  Vital signs in last 24 hours: Temp:  [97.9 F (36.6 C)-98.6 F (37 C)] 97.9 F (36.6 C) (04/29 1143) Pulse Rate:  [88-100] 89 (04/29 1143) Resp:  [13-19] 19 (04/29 0300) BP: (103-126)/(63-100) 103/64 (04/29 1143) SpO2:  [97 %-100 %] 97 % (04/29 1143) Weight:  [449 kg] 116 kg (04/28 1636) Last BM Date: 06/10/18 General:   Pleasant, cooperative in NAD Head:  Normocephalic and atraumatic. Eyes:   No icterus.   Conjunctiva pink. PERRLA. Ears:  Normal auditory acuity. Neck:  Supple; no masses or thyroidomegaly Lungs: Respirations even and unlabored. Lungs clear to auscultation bilaterally.   No wheezes, crackles, or rhonchi.  Heart:  Regular rate and rhythm;  Without murmur, clicks, rubs or gallops Abdomen:  Soft, obese, nondistended, mild diffuse tenderness. Normal bowel sounds. No appreciable masses or hepatomegaly.  No  rebound or guarding.  Rectal:  Not performed. Msk:  Symmetrical without gross deformities.  Strength normal Extremities:  Without edema, cyanosis or clubbing. Neurologic:  Alert and oriented x3;  grossly normal neurologically. Skin:  Intact without significant lesions or rashes. Psych:  Alert and cooperative. Normal affect.  LAB RESULTS: CBC Latest Ref Rng & Units 06/11/2018 06/10/2018 05/12/2018  WBC 4.0 - 10.5 K/uL 12.3(H) 16.0(H) 16.7(H)  Hemoglobin 12.0 - 15.0 g/dL 10.3(L) 11.6(L) 11.4(L)  Hematocrit 36.0 - 46.0 % 33.4(L) 37.2 36.7  Platelets 150 - 400 K/uL 351 393 321    BMET BMP Latest Ref Rng & Units 06/11/2018 06/10/2018 05/12/2018  Glucose 70 - 99 mg/dL 94 111(H) 115(H)  BUN 6 - 20 mg/dL 32(H) 34(H) 19  Creatinine 0.44 - 1.00 mg/dL 2.58(H) 3.01(H) 1.62(H)  Sodium 135 - 145 mmol/L 139 134(L) 139  Potassium 3.5 - 5.1 mmol/L 3.9 3.9 4.3  Chloride 98 - 111 mmol/L 106 99 103  CO2 22 - 32 mmol/L 21(L) 21(L) 22  Calcium 8.9 - 10.3 mg/dL 8.1(L) 8.4(L) 8.7(L)    LFT Hepatic Function Latest Ref Rng & Units 06/10/2018 12/17/2008 12/16/2008  Total Protein 6.5 - 8.1 g/dL 8.1 6.5 6.7  Albumin 3.5 - 5.0 g/dL 4.2 3.4(L) 3.6  AST 15 - 41 U/L '16 17 16  '$ ALT 0 - 44 U/L '14 12 11  '$ Alk Phosphatase 38 - 126 U/L 96 56 62  Total Bilirubin 0.3 - 1.2 mg/dL 0.7 0.8 0.7     STUDIES: Ct Abdomen Pelvis Wo Contrast  Result Date: 06/10/2018 CLINICAL DATA:  Fatigue. Abdominal pain. Symptoms for 30 days. Nausea and vomiting. Frequent bowel movements. Left lower quadrant pain. EXAM: CT ABDOMEN AND PELVIS WITHOUT CONTRAST TECHNIQUE: Multidetector CT imaging of the abdomen and pelvis was performed following the standard protocol without IV contrast. COMPARISON:  12/15/2008 FINDINGS: Lower chest: Clear lung bases. Normal heart size without pericardial or pleural effusion. Hepatobiliary: Moderate hepatic steatosis. Normal liver. Cholecystectomy, without biliary ductal dilatation. Pancreas: Normal, without mass or  ductal dilatation. Spleen: Normal in size, without focal abnormality. Adrenals/Urinary Tract: Normal adrenal glands. No renal calculi or hydronephrosis. The ureters are difficult to follow. No bladder calculi. Stomach/Bowel: Normal stomach, without wall thickening. Scattered colonic diverticula. Normal terminal ileum. Normal small bowel. Vascular/Lymphatic: Aortic and branch vessel atherosclerosis. No abdominopelvic adenopathy. Reproductive: Hysterectomy.  No adnexal mass. Other: No significant free fluid. Right abdominal  wall surgical sutures. Musculoskeletal: Lumbosacral spondylosis. IMPRESSION: 1. No acute process in the abdomen or pelvis. No explanation for patient's symptoms. 2. Hepatic steatosis. 3.  Aortic Atherosclerosis (ICD10-I70.0). Electronically Signed   By: Abigail Miyamoto M.D.   On: 06/10/2018 16:34   Dg Chest 1 View  Result Date: 06/10/2018 CLINICAL DATA:  63 year old female with leukocytosis, abdominal pain. EXAM: CHEST  1 VIEW COMPARISON:  CT Abdomen and Pelvis earlier today. Portable chest 05/12/2018. FINDINGS: Portable AP upright view at 2249 hours. Lung volumes and mediastinal contours are within normal limits. Visualized tracheal air column is within normal limits. Allowing for portable technique the lungs are clear. No pneumothorax or pneumoperitoneum identified. No acute osseous abnormality identified. IMPRESSION: Negative portable chest. Electronically Signed   By: Genevie Ann M.D.   On: 06/10/2018 23:09   US Renal  Result Date: 06/11/2018 CLINICAL DATA:  Acute renal failure EXAM: RENAL / URINARY TRACT ULTRASOUND COMPLETE COMPARISON:  CT abdomen pelvis 06/10/2018 FINDINGS: Right Kidney: Renal measurements: 11.3 x 4.9 x 5.2 cm = volume: 148.6 mL. Increased renal cortical echogenicity. No mass or hydronephrosis visualized. Left Kidney: Renal measurements: 10.1 x 5.8 x 4.1 cm = volume: 127.1 mL. Increased renal cortical echogenicity. No mass or hydronephrosis visualized. Within the midpole of  the left kidney there is a 2.4 x 2.6 x 1.7 cm cystic lesion. Bladder: Appears normal for degree of bladder distention. IMPRESSION: No hydronephrosis. Increased renal cortical echogenicity as can be seen with chronic medical renal disease. Electronically Signed   By: Lovey Newcomer M.D.   On: 06/11/2018 11:02      Impression / Plan:   JULANN MCGILVRAY is a 63 y.o. African-American female with metabolic syndrome presents with 4 weeks history of generalized abdominal pain associated with mushy stool and 2 days history of coffee-ground emesis and dark red bowel movement.  CT abdomen and pelvis without contrast did not reveal any acute intra-abdominal pathology.  Given that she has significant leukocytosis, recommend empiric antibiotics with Cipro and Flagyl, ordered Also, discussed with her about upper endoscopy and colonoscopy to evaluate for malignancy, inflammatory bowel disease, peptic ulcer disease and patient is agreeable  I have discussed alternative options, risks & benefits,  which include, but are not limited to, bleeding, infection, perforation,respiratory complication & drug reaction.  The patient agrees with this plan & written consent will be obtained.    Continue clear liquid diet Bowel prep tonight N.p.o. past midnight EGD and colonoscopy tomorrow  Thank you for involving me in the care of this patient.  GI will continue to follow along with you    LOS: 1 day   Sherri Sear, MD  06/11/2018, 3:49 PM   Note: This dictation was prepared with Dragon dictation along with smaller phrase technology. Any transcriptional errors that result from this process are unintentional.

## 2018-06-11 NOTE — Plan of Care (Signed)
  Problem: Education: Goal: Knowledge of General Education information will improve Description Including pain rating scale, medication(s)/side effects and non-pharmacologic comfort measures Outcome: Progressing   Problem: Clinical Measurements: Goal: Ability to maintain clinical measurements within normal limits will improve Outcome: Progressing Goal: Will remain free from infection Outcome: Progressing Goal: Diagnostic test results will improve Outcome: Progressing  Renal function improving. Patient for EGD/colonoscopy in am Goal: Respiratory complications will improve Outcome: Progressing Goal: Cardiovascular complication will be avoided Outcome: Progressing

## 2018-06-12 ENCOUNTER — Encounter: Payer: Self-pay | Admitting: Anesthesiology

## 2018-06-12 ENCOUNTER — Encounter
Admission: EM | Disposition: A | Discharge status: Home / Self Care | Payer: Self-pay | Source: Home / Self Care | Attending: Emergency Medicine

## 2018-06-12 ENCOUNTER — Inpatient Hospital Stay: Payer: 59 | Admitting: Anesthesiology

## 2018-06-12 DIAGNOSIS — D125 Benign neoplasm of sigmoid colon: Secondary | ICD-10-CM | POA: Diagnosis not present

## 2018-06-12 DIAGNOSIS — D126 Benign neoplasm of colon, unspecified: Secondary | ICD-10-CM | POA: Diagnosis not present

## 2018-06-12 DIAGNOSIS — K625 Hemorrhage of anus and rectum: Secondary | ICD-10-CM | POA: Diagnosis not present

## 2018-06-12 DIAGNOSIS — I10 Essential (primary) hypertension: Secondary | ICD-10-CM | POA: Diagnosis not present

## 2018-06-12 DIAGNOSIS — D124 Benign neoplasm of descending colon: Secondary | ICD-10-CM | POA: Diagnosis not present

## 2018-06-12 DIAGNOSIS — K920 Hematemesis: Secondary | ICD-10-CM | POA: Diagnosis not present

## 2018-06-12 DIAGNOSIS — R1084 Generalized abdominal pain: Secondary | ICD-10-CM | POA: Diagnosis not present

## 2018-06-12 DIAGNOSIS — K5790 Diverticulosis of intestine, part unspecified, without perforation or abscess without bleeding: Secondary | ICD-10-CM | POA: Diagnosis not present

## 2018-06-12 DIAGNOSIS — N179 Acute kidney failure, unspecified: Secondary | ICD-10-CM | POA: Diagnosis not present

## 2018-06-12 HISTORY — PX: ESOPHAGOGASTRODUODENOSCOPY: SHX5428

## 2018-06-12 HISTORY — PX: COLONOSCOPY: SHX5424

## 2018-06-12 LAB — BASIC METABOLIC PANEL
Anion gap: 10 (ref 5–15)
BUN: 25 mg/dL — ABNORMAL HIGH (ref 6–20)
CO2: 22 mmol/L (ref 22–32)
Calcium: 8.2 mg/dL — ABNORMAL LOW (ref 8.9–10.3)
Chloride: 107 mmol/L (ref 98–111)
Creatinine, Ser: 1.77 mg/dL — ABNORMAL HIGH (ref 0.44–1.00)
GFR calc Af Amer: 36 mL/min — ABNORMAL LOW (ref >60)
GFR calc non Af Amer: 31 mL/min — ABNORMAL LOW (ref >60)
Glucose, Bld: 133 mg/dL — ABNORMAL HIGH (ref 70–99)
Potassium: 3.9 mmol/L (ref 3.5–5.1)
Sodium: 139 mmol/L (ref 135–145)

## 2018-06-12 LAB — CBC
HCT: 34.6 % — ABNORMAL LOW (ref 36.0–46.0)
Hemoglobin: 10.6 g/dL — ABNORMAL LOW (ref 12.0–15.0)
MCH: 26.6 pg (ref 26.0–34.0)
MCHC: 30.6 g/dL (ref 30.0–36.0)
MCV: 86.7 fL (ref 80.0–100.0)
Platelets: 361 10*3/uL (ref 150–400)
RBC: 3.99 MIL/uL (ref 3.87–5.11)
RDW: 15.7 % — ABNORMAL HIGH (ref 11.5–15.5)
WBC: 11.1 10*3/uL — ABNORMAL HIGH (ref 4.0–10.5)
nRBC: 0 % (ref 0.0–0.2)

## 2018-06-12 LAB — H PYLORI, IGM, IGG, IGA AB
H Pylori IgG: 0.59 Index Value (ref 0.00–0.79)
H. Pylogi, Iga Abs: <9 units (ref 0.0–8.9)
H. Pylogi, Igm Abs: <9 units (ref 0.0–8.9)

## 2018-06-12 LAB — GLUCOSE, CAPILLARY
Glucose-Capillary: 90 mg/dL (ref 70–99)
Glucose-Capillary: 107 mg/dL — ABNORMAL HIGH (ref 70–99)
Glucose-Capillary: 121 mg/dL — ABNORMAL HIGH (ref 70–99)
Glucose-Capillary: 97 mg/dL (ref 70–99)

## 2018-06-12 LAB — HEMOGLOBIN A1C
Hgb A1c MFr Bld: 6.7 % — ABNORMAL HIGH (ref 4.8–5.6)
Mean Plasma Glucose: 145.59 mg/dL

## 2018-06-12 LAB — HIV ANTIBODY (ROUTINE TESTING W REFLEX): HIV Screen 4th Generation wRfx: NONREACTIVE

## 2018-06-12 SURGERY — ESOPHAGOGASTRODUODENOSCOPY (EGD) WITH PROPOFOL
Anesthesia: General

## 2018-06-12 SURGERY — EGD (ESOPHAGOGASTRODUODENOSCOPY)
Anesthesia: General

## 2018-06-12 MED ORDER — LIDOCAINE HCL (PF) 2 % IJ SOLN
INTRAMUSCULAR | Status: AC
  Filled 2018-06-12: qty 10

## 2018-06-12 MED ORDER — PROPOFOL 10 MG/ML IV BOLUS
INTRAVENOUS | Status: AC
  Filled 2018-06-12: qty 20

## 2018-06-12 MED ORDER — PROPOFOL 10 MG/ML IV BOLUS
INTRAVENOUS | Status: DC | PRN
  Administered 2018-06-12: 80 mg via INTRAVENOUS
  Administered 2018-06-12 (×2): 50 mg via INTRAVENOUS

## 2018-06-12 MED ORDER — PROPOFOL 500 MG/50ML IV EMUL
INTRAVENOUS | Status: DC | PRN
  Administered 2018-06-12: 80 ug/kg/min via INTRAVENOUS

## 2018-06-12 MED ORDER — MAGNESIUM CITRATE PO SOLN
1.0000 | Freq: Once | ORAL | Status: AC
  Administered 2018-06-12: 1 via ORAL
  Filled 2018-06-12: qty 296

## 2018-06-12 MED ORDER — POLYETHYLENE GLYCOL 3350 17 GM/SCOOP PO POWD
1.0000 | Freq: Once | ORAL | Status: AC
  Administered 2018-06-12: 02:00:00 255 g via ORAL
  Filled 2018-06-12: qty 255

## 2018-06-12 MED ORDER — SODIUM CHLORIDE 0.9 % IV SOLN
INTRAVENOUS | Status: DC
  Administered 2018-06-12: 1000 mL via INTRAVENOUS

## 2018-06-12 MED ORDER — PROPOFOL 500 MG/50ML IV EMUL
INTRAVENOUS | Status: AC
  Filled 2018-06-12: qty 50

## 2018-06-12 NOTE — Op Note (Signed)
[facility] Gastroenterology Patient Name: Claudia Hunter Procedure Date: 06/12/2018 11:25 AM MRN: [id number] Account #: [id number] Date of Birth: [birth year] Admit Type: Inpatient Age: 63 Room: [facility] ENDO ROOM 4 Gender: Female Note Status: Finalized Procedure:            Upper GI endoscopy Indications:          Coffee-ground emesis Providers:            Lin Landsman MD, MD Referring MD:         Rusty Aus, MD (Referring MD) Medicines:            Monitored Anesthesia Care Complications:        No immediate complications. Estimated blood loss: None. Procedure:            Pre-Anesthesia Assessment:                       - Prior to the procedure, a History and Physical was                        performed, and patient medications and allergies were                        reviewed. The patient is competent. The risks and                        benefits of the procedure and the sedation options and                        risks were discussed with the patient. All questions                        were answered and informed consent was obtained.                        Patient identification and proposed procedure were                        verified by the physician, the nurse, the                        anesthesiologist, the anesthetist and the technician in                        the pre-procedure area in the procedure room in the                        endoscopy suite. Mental Status Examination: alert and                        oriented. Airway Examination: normal oropharyngeal                        airway and neck mobility. Respiratory Examination:                        clear to auscultation. CV Examination: normal.                        Prophylactic Antibiotics: The patient does not require  prophylactic antibiotics. Prior Anticoagulants: The                        patient has taken no previous anticoagulant or   antiplatelet agents. ASA Grade Assessment: III - A                        patient with severe systemic disease. After reviewing                        the risks and benefits, the patient was deemed in                        satisfactory condition to undergo the procedure. The                        anesthesia plan was to use monitored anesthesia care                        (MAC). Immediately prior to administration of                        medications, the patient was re-assessed for adequacy                        to receive sedatives. The heart rate, respiratory rate,                        oxygen saturations, blood pressure, adequacy of                        pulmonary ventilation, and response to care were                        monitored throughout the procedure. The physical status                        of the patient was re-assessed after the procedure.                       After obtaining informed consent, the endoscope was                        passed under direct vision. Throughout the procedure,                        the patient's blood pressure, pulse, and oxygen                        saturations were monitored continuously. The                        Colonoscope was introduced through the mouth, and                        advanced to the second part of duodenum. The upper GI                        endoscopy was accomplished without difficulty. The  patient tolerated the procedure well. Findings:      The duodenal bulb and second portion of the duodenum were normal.      The entire examined stomach was normal.      The cardia and gastric fundus were normal on retroflexion.      The gastroesophageal junction and examined esophagus were normal. Impression:           - Normal duodenal bulb and second portion of the                        duodenum.                       - Normal stomach.                       - Normal gastroesophageal junction and  esophagus.                       - No specimens collected. Recommendation:       - Use Prilosec (omeprazole) 20 mg PO BID.                       - Proceed with colonoscopy as scheduled                       See colonoscopy report Procedure Code(s):    --- Professional ---                       [phone], Esophagogastroduodenoscopy, flexible, transoral;                        diagnostic, including collection of specimen(s) by                        brushing or washing, when performed (separate procedure) Diagnosis Code(s):    --- Professional ---                       K92.0, Hematemesis CPT copyright 2019 [employer]. All rights reserved. The codes documented in this report are preliminary and upon coder review may  be revised to meet current compliance requirements. Dr. Ulyess Mort Lin Landsman MD, MD 06/12/2018 11:49:37 AM This report has been signed electronically. Number of Addenda: 0 Note Initiated On: 06/12/2018 11:25 AM      [facility]

## 2018-06-12 NOTE — Anesthesia Postprocedure Evaluation (Signed)
Anesthesia Post Note  Patient: Claudia Hunter  Procedure(s) Performed: ESOPHAGOGASTRODUODENOSCOPY (EGD) (N/A ) COLONOSCOPY (N/A )  Patient location during evaluation: Endoscopy Anesthesia Type: General Level of consciousness: awake and alert Pain management: pain level controlled Vital Signs Assessment: post-procedure vital signs reviewed and stable Respiratory status: spontaneous breathing, nonlabored ventilation, respiratory function stable and patient connected to nasal cannula oxygen Cardiovascular status: blood pressure returned to baseline and stable Postop Assessment: no apparent nausea or vomiting Anesthetic complications: no     Last Vitals:  Vitals:   06/12/18 1307 06/12/18 1317  BP: (!) 88/46 (!) 91/48  Pulse:  87  Resp:    Temp:    SpO2:      Last Pain:  Vitals:   06/12/18 1247  TempSrc: Tympanic  PainSc: Asleep                 Precious Haws Piscitello

## 2018-06-12 NOTE — Op Note (Signed)
[facility] Gastroenterology Patient Name: Claudia Hunter Procedure Date: 06/12/2018 11:28 AM MRN: [id number] Account #: [id number] Date of Birth: 03-Nov-1955 Admit Type: Outpatient Age: 63 Room: [facility] ENDO ROOM 4 Gender: Female Note Status: Finalized Procedure:            Colonoscopy Indications:          Rectal bleeding Providers:            Lin Landsman MD, MD Medicines:            Monitored Anesthesia Care Complications:        No immediate complications. Estimated blood loss: None. Procedure:            Pre-Anesthesia Assessment:                       - Prior to the procedure, a History and Physical was                        performed, and patient medications and allergies were                        reviewed. The patient is competent. The risks and                        benefits of the procedure and the sedation options and                        risks were discussed with the patient. All questions                        were answered and informed consent was obtained.                        Patient identification and proposed procedure were                        verified by the physician, the nurse, the                        anesthesiologist, the anesthetist and the technician in                        the pre-procedure area in the procedure room in the                        endoscopy suite. Mental Status Examination: alert and                        oriented. Airway Examination: normal oropharyngeal                        airway and neck mobility. Respiratory Examination:                        clear to auscultation. CV Examination: normal.                        Prophylactic Antibiotics: The patient does not require  prophylactic antibiotics. Prior Anticoagulants: The                        patient has taken no previous anticoagulant or                        antiplatelet agents. ASA Grade Assessment: III - A        patient with severe systemic disease. After reviewing                        the risks and benefits, the patient was deemed in                        satisfactory condition to undergo the procedure. The                        anesthesia plan was to use monitored anesthesia care                        (MAC). Immediately prior to administration of                        medications, the patient was re-assessed for adequacy                        to receive sedatives. The heart rate, respiratory rate,                        oxygen saturations, blood pressure, adequacy of                        pulmonary ventilation, and response to care were                        monitored throughout the procedure. The physical status                        of the patient was re-assessed after the procedure.                       After obtaining informed consent, the colonoscope was                        passed under direct vision. Throughout the procedure,                        the patient's blood pressure, pulse, and oxygen                        saturations were monitored continuously. The Endoscope                        was introduced through the anus and advanced to the the                        cecum, identified by appendiceal orifice and ileocecal                        valve. The colonoscopy was unusually difficult due to  significant looping and the patient's body habitus.                        Successful completion of the procedure was aided by                        applying abdominal pressure. The patient tolerated the                        procedure well. The quality of the bowel preparation                        was fair. Findings:      Hemorrhoids were found on perianal exam.      A 3 mm polyp was found in the descending colon. The polyp was sessile.       The polyp was removed with a cold biopsy forceps. Resection and       retrieval were complete.      A 6  mm polyp was found in the sigmoid colon. The polyp was sessile. The       polyp was removed with a cold snare. Resection and retrieval were       complete.      Multiple diverticula were found in the sigmoid colon. There was no       evidence of diverticular bleeding.      Non-bleeding external and internal hemorrhoids were found during       retroflexion. The hemorrhoids were large. Impression:           - Preparation of the colon was fair.                       - Hemorrhoids found on perianal exam.                       - One 3 mm polyp in the descending colon, removed with                        a cold biopsy forceps. Resected and retrieved.                       - One 6 mm polyp in the sigmoid colon, removed with a                        cold snare. Resected and retrieved.                       - Severe diverticulosis in the sigmoid colon. There was                        no evidence of diverticular bleeding.                       - Non-bleeding external and internal hemorrhoids,                        source of rectal bleeding. Recommendation:       - Return patient to hospital ward for possible                        discharge  same day.                       - Cardiac diet today.                       - Continue present medications.                       - Await pathology results.                       - Repeat colonoscopy in 5 years for surveillance based                        on pathology results.                       - Return to my office at appointment to be scheduled                        for hemorrhoid ligation Procedure Code(s):    --- Professional ---                       [phone], Colonoscopy, flexible; with removal of tumor(s),                        polyp(s), or other lesion(s) by snare technique                       [id number], 59, Colonoscopy, flexible; with biopsy, single                        or multiple Diagnosis Code(s):    --- Professional ---                        K64.8, Other hemorrhoids                       K63.5, Polyp of colon                       K62.5, Hemorrhage of anus and rectum                       K57.30, Diverticulosis of large intestine without                        perforation or abscess without bleeding CPT copyright 2019 American [employer] Association. All rights reserved. The codes documented in this report are preliminary and upon coder review may  be revised to meet current compliance requirements. Dr. Ulyess Mort Lin Landsman MD, MD 06/12/2018 12:31:35 PM This report has been signed electronically. Number of Addenda: 0 Note Initiated On: 06/12/2018 11:28 AM Total Procedure Duration: 0 hours 24 minutes 2 seconds       [facility]

## 2018-06-12 NOTE — Progress Notes (Signed)
Spoke with dr. Marius Ditch. Pt only able to tolerate drinking half golytely and states she can not drink anymore. Orders for magnesium citrate and miralax bowel prep. Drink at least by 5am. Cont to monitor.

## 2018-06-12 NOTE — Progress Notes (Signed)
06/12/2018 4:14 PM  Claudia Hunter to be D/C'd Home per MD order.  Discussed prescriptions and follow up appointments with the patient. Prescriptions given to patient, medication list explained in detail. Pt verbalized understanding.  Allergies as of 06/12/2018      Reactions   Asa [aspirin] Nausea And Vomiting   Other reaction(s): Other (See Comments) Tremors, vertigo dizziness   Celecoxib    Other reaction(s): Dizziness   Clemastine    Other reaction(s): Unknown   Limonene    Other reaction(s): Nausea And Vomiting dizziness   Lovastatin Other (See Comments)   Made her feel like she is in trance/suicidal    Oxycodone-acetaminophen Other (See Comments)   Percocet [oxycodone-acetaminophen]    Other reaction(s): Hallucination hallucinations      Medication List    STOP taking these medications   naproxen 500 MG tablet Commonly known as:  NAPROSYN     TAKE these medications   allopurinol 300 MG tablet Commonly known as:  ZYLOPRIM Take 300 mg by mouth daily.   atenolol 50 MG tablet Commonly known as:  TENORMIN Take 50 mg by mouth daily.   BIOTIN PO Take 1 tablet by mouth daily.   cyclobenzaprine 10 MG tablet Commonly known as:  FLEXERIL take 1 tablet by mouth twice a day for MUSCLES   FERGON PO Take 2 tablets by mouth daily.   furosemide 20 MG tablet Commonly known as:  LASIX Take 20 mg by mouth daily.   gabapentin 100 MG capsule Commonly known as:  NEURONTIN Take 200 mg by mouth 2 (two) times daily.   levothyroxine 150 MCG tablet Commonly known as:  SYNTHROID Take 150 mcg by mouth daily.   magnesium oxide 400 MG tablet Commonly known as:  MAG-OX Take 400 mg by mouth daily.   metFORMIN 500 MG tablet Commonly known as:  GLUCOPHAGE Take 500 mg by mouth 2 (two) times daily with a meal.   pantoprazole 40 MG tablet Commonly known as:  PROTONIX Take 40 mg by mouth daily.   pravastatin 20 MG tablet Commonly known as:  PRAVACHOL Take 20 mg by mouth  Nightly.   RA Vitamin B-12 TR 1000 MCG Tbcr Generic drug:  Cyanocobalamin Take by mouth. Reported on 05/24/2015   telmisartan-hydrochlorothiazide 40-12.5 MG tablet Commonly known as:  MICARDIS HCT Take 1 tablet by mouth daily.   traMADol 50 MG tablet Commonly known as:  ULTRAM Take 50 mg by mouth every 6 (six) hours as needed for moderate pain.   Vitamin D-1000 Max St 25 MCG (1000 UT) tablet Generic drug:  Cholecalciferol Take by mouth.   Vitron-C 65-125 MG Tabs Generic drug:  Iron-Vitamin C Take 1 tablet by mouth daily.   zolpidem 10 MG tablet Commonly known as:  AMBIEN Take 10 mg by mouth at bedtime.       Vitals:   06/12/18 1307 06/12/18 1317  BP: (!) 88/46 (!) 91/48  Pulse:  87  Resp:    Temp:    SpO2:      Skin clean, dry and intact without evidence of skin break down, no evidence of skin tears noted. IV catheter discontinued intact. Site without signs and symptoms of complications. Dressing and pressure applied. Pt denies pain at this time. No complaints noted.  An After Visit Summary was printed and given to the patient. Patient escorted via [place], and D/C home via private auto.  Dola Argyle

## 2018-06-12 NOTE — Anesthesia Preprocedure Evaluation (Signed)
Anesthesia Evaluation  Patient identified by MRN, date of birth, ID band Patient awake    Reviewed: Allergy & Precautions, H&P , NPO status , Patient's Chart, lab work & pertinent test results  History of Anesthesia Complications Negative for: history of anesthetic complications  Airway Mallampati: II  TM Distance: >3 FB Neck ROM: full    Dental  (+) Chipped, Poor Dentition   Pulmonary neg pulmonary ROS, neg shortness of breath,           Cardiovascular Exercise Tolerance: Good hypertension, (-) angina(-) Past MI and (-) DOE      Neuro/Psych  Neuromuscular disease negative neurological ROS  negative psych ROS   GI/Hepatic negative GI ROS, Neg liver ROS, neg GERD  ,  Endo/Other  diabetes, Oral Hypoglycemic AgentsHypothyroidism   Renal/GU ARFRenal disease  negative genitourinary   Musculoskeletal  (+) Arthritis ,   Abdominal   Peds  Hematology negative hematology ROS (+)   Anesthesia Other Findings Patient is NPO appropriate and reports no nausea or vomiting today.  Past Medical History: No date: Diabetes mellitus without complication (HCC) No date: Diverticulitis No date: Gout No date: Hypertension No date: Neuropathy  Past Surgical History: No date: ABDOMINAL HYSTERECTOMY No date: CESAREAN SECTION No date: CHOLECYSTECTOMY  BMI    Body Mass Index:  41.28 kg/m      Reproductive/Obstetrics negative OB ROS                             Anesthesia Physical Anesthesia Plan  ASA: III  Anesthesia Plan: General   Post-op Pain Management:    Induction: Intravenous  PONV Risk Score and Plan: Propofol infusion and TIVA  Airway Management Planned: Natural Airway and Nasal Cannula  Additional Equipment:   Intra-op Plan:   Post-operative Plan:   Informed Consent: I have reviewed the patients History and Physical, chart, labs and discussed the procedure including the risks,  benefits and alternatives for the proposed anesthesia with the patient or authorized representative who has indicated his/her understanding and acceptance.     Dental Advisory Given  Plan Discussed with: Anesthesiologist, CRNA and Surgeon  Anesthesia Plan Comments: (Patient consented for risks of anesthesia including but not limited to:  - adverse reactions to medications - risk of intubation if required - damage to teeth, lips or other oral mucosa - sore throat or hoarseness - Damage to heart, brain, lungs or loss of life  Patient voiced understanding.)        Anesthesia Quick Evaluation

## 2018-06-12 NOTE — Progress Notes (Signed)
[place] [facility] [place] NAME: Arretta Toenjes    MR#:  [id number]  DATE OF BIRTH:  1955/12/31  SUBJECTIVE:  Patient going for colonoscopy and EGD today. Had some nausea with bowel prep   REVIEW OF SYSTEMS:    Review of Systems  Constitutional: Negative for fever, chills weight loss HENT: Negative for ear pain, nosebleeds, congestion, facial swelling, rhinorrhea, neck pain, neck stiffness and ear discharge.   Respiratory: Negative for cough, shortness of breath, wheezing  Cardiovascular: Negative for chest pain, palpitations and leg swelling.  Gastrointestinal: Negative for heartburn, abdominal pain, vomiting, diarrhea or consitpation + nausea Genitourinary: Negative for dysuria, urgency, frequency, hematuria Musculoskeletal: Negative for back pain or joint pain Neurological: Negative for dizziness, seizures, syncope, focal weakness,  numbness and headaches.  Hematological: Does not bruise/bleed easily.  Psychiatric/Behavioral: Negative for hallucinations, confusion, dysphoric mood    Tolerating Diet: NPO for procedures      DRUG ALLERGIES:   Allergies  Allergen Reactions  . Asa [Aspirin] Nausea And Vomiting    Other reaction(s): Other (See Comments) Tremors, vertigo dizziness   . Celecoxib     Other reaction(s): Dizziness  . Clemastine     Other reaction(s): Unknown  . Limonene     Other reaction(s): Nausea And Vomiting dizziness  . Lovastatin Other (See Comments)    Made her feel like she is in trance/suicidal   . Oxycodone-Acetaminophen Other (See Comments)  . Percocet [Oxycodone-Acetaminophen]     Other reaction(s): Hallucination hallucinations    VITALS:  Blood pressure 111/75, pulse 89, temperature 98 F (36.7 C), temperature source Oral, resp. rate 18, height 5\' 6"  (1.676 m), weight 116 kg, SpO2 100 %.  PHYSICAL EXAMINATION:  Constitutional: Appears well-developed and well-nourished. No distress. HENT:  Normocephalic. Marland Kitchen Oropharynx is clear and moist.  Eyes: Conjunctivae and EOM are normal. PERRLA, no scleral icterus.  Neck: Normal ROM. Neck supple. No JVD. No tracheal deviation. CVS: RRR, S1/S2 +, no murmurs, no gallops, no carotid bruit.  Pulmonary: Effort and breath sounds normal, no stridor, rhonchi, wheezes, rales.  Abdominal: Soft. BS +,  no distension, tenderness, rebound or guarding.  Musculoskeletal: Normal range of motion. No edema and no tenderness.  Neuro: Alert. CN 2-12 grossly intact. No focal deficits. Skin: Skin is warm and dry. No rash noted. Psychiatric: Normal mood and affect.      LABORATORY PANEL:   CBC Recent Labs  Lab 06/12/18 0434  WBC 11.1*  HGB 10.6*  HCT 34.6*  PLT 361   ------------------------------------------------------------------------------------------------------------------  Chemistries  Recent Labs  Lab 06/10/18 1645  06/12/18 0434  NA 134*   < > 139  K 3.9   < > 3.9  CL 99   < > 107  CO2 21*   < > 22  GLUCOSE 111*   < > 133*  BUN 34*   < > 25*  CREATININE 3.01*   < > 1.77*  CALCIUM 8.4*   < > 8.2*  AST 16  --   --   ALT 14  --   --   ALKPHOS 96  --   --   BILITOT 0.7  --   --    < > = values in this interval not displayed.   ------------------------------------------------------------------------------------------------------------------  Cardiac Enzymes Recent Labs  Lab 06/10/18 1645  TROPONINI <0.03   ------------------------------------------------------------------------------------------------------------------  RADIOLOGY:  Ct Abdomen Pelvis Wo Contrast  Result Date: 06/10/2018 CLINICAL DATA:  Fatigue. Abdominal pain. Symptoms for 30 days. Nausea  and vomiting. Frequent bowel movements. Left lower quadrant pain. EXAM: CT ABDOMEN AND PELVIS WITHOUT CONTRAST TECHNIQUE: Multidetector CT imaging of the abdomen and pelvis was performed following the standard protocol without IV contrast. COMPARISON:  12/15/2008 FINDINGS:  Lower chest: Clear lung bases. Normal heart size without pericardial or pleural effusion. Hepatobiliary: Moderate hepatic steatosis. Normal liver. Cholecystectomy, without biliary ductal dilatation. Pancreas: Normal, without mass or ductal dilatation. Spleen: Normal in size, without focal abnormality. Adrenals/Urinary Tract: Normal adrenal glands. No renal calculi or hydronephrosis. The ureters are difficult to follow. No bladder calculi. Stomach/Bowel: Normal stomach, without wall thickening. Scattered colonic diverticula. Normal terminal ileum. Normal small bowel. Vascular/Lymphatic: Aortic and branch vessel atherosclerosis. No abdominopelvic adenopathy. Reproductive: Hysterectomy.  No adnexal mass. Other: No significant free fluid. Right abdominal wall surgical sutures. Musculoskeletal: Lumbosacral spondylosis. IMPRESSION: 1. No acute process in the abdomen or pelvis. No explanation for patient's symptoms. 2. Hepatic steatosis. 3.  Aortic Atherosclerosis (ICD10-I70.0). Electronically Signed   By: Abigail Miyamoto M.D.   On: 06/10/2018 16:34   Dg Chest 1 View  Result Date: 06/10/2018 CLINICAL DATA:  63 year old female with leukocytosis, abdominal pain. EXAM: CHEST  1 VIEW COMPARISON:  CT Abdomen and Pelvis earlier today. Portable chest 05/12/2018. FINDINGS: Portable AP upright view at 2249 hours. Lung volumes and mediastinal contours are within normal limits. Visualized tracheal air column is within normal limits. Allowing for portable technique the lungs are clear. No pneumothorax or pneumoperitoneum identified. No acute osseous abnormality identified. IMPRESSION: Negative portable chest. Electronically Signed   By: Genevie Ann M.D.   On: 06/10/2018 23:09   US Renal  Result Date: 06/11/2018 CLINICAL DATA:  Acute renal failure EXAM: RENAL / URINARY TRACT ULTRASOUND COMPLETE COMPARISON:  CT abdomen pelvis 06/10/2018 FINDINGS: Right Kidney: Renal measurements: 11.3 x 4.9 x 5.2 cm = volume: 148.6 mL. Increased renal  cortical echogenicity. No mass or hydronephrosis visualized. Left Kidney: Renal measurements: 10.1 x 5.8 x 4.1 cm = volume: 127.1 mL. Increased renal cortical echogenicity. No mass or hydronephrosis visualized. Within the midpole of the left kidney there is a 2.4 x 2.6 x 1.7 cm cystic lesion. Bladder: Appears normal for degree of bladder distention. IMPRESSION: No hydronephrosis. Increased renal cortical echogenicity as can be seen with chronic medical renal disease. Electronically Signed   By: Lovey Newcomer M.D.   On: 06/11/2018 11:02     ASSESSMENT AND PLAN:   63 year old female with hypertension and diabetes who was sent from PCP office due to acute kidney injury seen on labs.  1.  Acute kidney injury: This is due to poor p.o. intake.   Creatinine has improved. Renal ultrasound showed no evidence of hydronephrosis. She will need to continue to avoid nephrotoxic medications.  2.  Generalized abdominal pain with melena: Plan for GI consultation and EGD today. CT the abdomen showed no acute pathology. Hemoglobin has remained stable.   She denies melena or hematochezia overnight.  3.  Diabetes: Continue ADA diet.   4.  Essential hypertension: Continue atenolol  5.  Hypothyroidism: Continue Synthroid  6  Hyperlipidemia: Continue pravastatin   7. Anemia of chronic disease: Iron panel consistent with chronic disease 8.  Hyponatremia from hypovolemia which has improved with IV fluids  Management plans discussed with the patient and she is in agreement.  CODE STATUS: full  TOTAL TIME TAKING CARE OF THIS PATIENT: 24 minutes.     POSSIBLE D/C possibly today after procedures, DEPENDING ON CLINICAL CONDITION.   Bettey Costa M.D on 06/12/2018 at 10:38  AM  Between 7am to 6pm - Pager - [phone] After 6pm go to [email or web address] - password [email or web address] [facility]  Office  [phone]  CC: Primary care physician; Rusty Aus, MD  Note: This dictation was prepared  with Dragon dictation along with smaller phrase technology. Any transcriptional errors that result from this process are unintentional.

## 2018-06-12 NOTE — Transfer of Care (Signed)
Immediate Anesthesia Transfer of Care Note  Patient: Claudia Hunter  Procedure(s) Performed: ESOPHAGOGASTRODUODENOSCOPY (EGD) (N/A ) COLONOSCOPY (N/A )  Patient Location: Endoscopy Unit  Anesthesia Type:General  Level of Consciousness: drowsy and patient cooperative  Airway & Oxygen Therapy: Patient Spontanous Breathing and Patient connected to nasal cannula oxygen  Post-op Assessment: Report given to RN and Post -op Vital signs reviewed and stable  Post vital signs: Reviewed and stable  Last Vitals:  Vitals Value Taken Time  BP 105/63 06/12/2018 12:27 PM  Temp    Pulse 83 06/12/2018 12:27 PM  Resp 18 06/12/2018 12:27 PM  SpO2 97 % 06/12/2018 12:27 PM    Last Pain:  Vitals:   06/12/18 1053  TempSrc: Tympanic  PainSc: 0-No pain      Patients Stated Pain Goal: 0 (54/65/68 1275)  Complications: No apparent anesthesia complications

## 2018-06-12 NOTE — Discharge Summary (Signed)
[facility] [place] NAME: Claudia Hunter    MR#:  [id number]  DATE OF BIRTH:  January 30, 1956  DATE OF ADMISSION:  06/10/2018 ADMITTING PHYSICIAN: Sela Hua, MD  DATE OF DISCHARGE: 06/12/2018  PRIMARY CARE PHYSICIAN: Rusty Aus, MD    ADMISSION DIAGNOSIS:  AKI (acute kidney injury) ([place]) [N17.9] Abdominal pain, unspecified abdominal location [R10.9]  DISCHARGE DIAGNOSIS:  Active Problems:   AKI (acute kidney injury) ([place])   SECONDARY DIAGNOSIS:   Past Medical History:  Diagnosis Date  . Diabetes mellitus without complication ([place])   . Diverticulitis   . Gout   . Hypertension   . Neuropathy     HOSPITAL COURSE:    63 year old female with hypertension and diabetes who was sent from PCP office due to acute kidney injury seen on labs.  1.  Acute kidney injury: This is due to poor p.o. intake.   Creatinine has improved. Renal ultrasound showed no evidence of hydronephrosis.  2.  Generalized abdominal pain with melena:  CT the abdomen showed no acute pathology. Hemoglobin has remained stable.   He underwent EGD and colonoscopy.  EGD was essentially unremarkable.  Colonoscopy showed large hemorrhoids.  She can have outpatient follow-up for hemorrhoid ligation if she decides to as per GI.   3.  Diabetes: Continue ADA diet.   4.  Essential hypertension: Continue atenolol  5.  Hypothyroidism: Continue Synthroid  6  Hyperlipidemia: Continue pravastatin   7. Anemia of chronic disease: Iron panel consistent with chronic disease 8.  Hyponatremia from hypovolemia which has improved with IV fluids   DISCHARGE CONDITIONS AND DIET:   Stable for discharge heart healthy diabetic diet  CONSULTS OBTAINED:    DRUG ALLERGIES:   Allergies  Allergen Reactions  . Asa [Aspirin] Nausea And Vomiting    Other reaction(s): Other (See Comments) Tremors, vertigo dizziness   . Celecoxib     Other reaction(s): Dizziness   . Clemastine     Other reaction(s): Unknown  . Limonene     Other reaction(s): Nausea And Vomiting dizziness  . Lovastatin Other (See Comments)    Made her feel like she is in trance/suicidal   . Oxycodone-Acetaminophen Other (See Comments)  . Percocet [Oxycodone-Acetaminophen]     Other reaction(s): Hallucination hallucinations    DISCHARGE MEDICATIONS:   Allergies as of 06/12/2018      Reactions   Asa [aspirin] Nausea And Vomiting   Other reaction(s): Other (See Comments) Tremors, vertigo dizziness   Celecoxib    Other reaction(s): Dizziness   Clemastine    Other reaction(s): Unknown   Limonene    Other reaction(s): Nausea And Vomiting dizziness   Lovastatin Other (See Comments)   Made her feel like she is in trance/suicidal    Oxycodone-acetaminophen Other (See Comments)   Percocet [oxycodone-acetaminophen]    Other reaction(s): Hallucination hallucinations      Medication List    STOP taking these medications   naproxen 500 MG tablet Commonly known as:  NAPROSYN     TAKE these medications   allopurinol 300 MG tablet Commonly known as:  ZYLOPRIM Take 300 mg by mouth daily.   atenolol 50 MG tablet Commonly known as:  TENORMIN Take 50 mg by mouth daily.   BIOTIN PO Take 1 tablet by mouth daily.   cyclobenzaprine 10 MG tablet Commonly known as:  FLEXERIL take 1 tablet by mouth twice a day for MUSCLES   FERGON PO Take 2 tablets by mouth daily.  furosemide 20 MG tablet Commonly known as:  LASIX Take 20 mg by mouth daily.   gabapentin 100 MG capsule Commonly known as:  NEURONTIN Take 200 mg by mouth 2 (two) times daily.   levothyroxine 150 MCG tablet Commonly known as:  SYNTHROID Take 150 mcg by mouth daily.   magnesium oxide 400 MG tablet Commonly known as:  MAG-OX Take 400 mg by mouth daily.   metFORMIN 500 MG tablet Commonly known as:  GLUCOPHAGE Take 500 mg by mouth 2 (two) times daily with a meal.   pantoprazole 40 MG  tablet Commonly known as:  PROTONIX Take 40 mg by mouth daily.   pravastatin 20 MG tablet Commonly known as:  PRAVACHOL Take 20 mg by mouth Nightly.   RA Vitamin B-12 TR 1000 MCG Tbcr Generic drug:  Cyanocobalamin Take by mouth. Reported on 05/24/2015   telmisartan-hydrochlorothiazide 40-12.5 MG tablet Commonly known as:  MICARDIS HCT Take 1 tablet by mouth daily.   traMADol 50 MG tablet Commonly known as:  ULTRAM Take 50 mg by mouth every 6 (six) hours as needed for moderate pain.   Vitamin D-1000 Max St 25 MCG (1000 UT) tablet Generic drug:  Cholecalciferol Take by mouth.   Vitron-C 65-125 MG Tabs Generic drug:  Iron-Vitamin C Take 1 tablet by mouth daily.   zolpidem 10 MG tablet Commonly known as:  AMBIEN Take 10 mg by mouth at bedtime.         Today   CHIEF COMPLAINT:  Patient underwent EGD and colonoscopy.   VITAL SIGNS:  Blood pressure 105/63, pulse 97, temperature 98 F (36.7 C), temperature source Tympanic, resp. rate 18, height 5\' 6"  (1.676 m), weight 116 kg, SpO2 97 %.   REVIEW OF SYSTEMS:  Review of Systems  Constitutional: Negative.  Negative for chills, fever and malaise/fatigue.  HENT: Negative.  Negative for ear discharge, ear pain, hearing loss, nosebleeds and sore throat.   Eyes: Negative.  Negative for blurred vision and pain.  Respiratory: Negative.  Negative for cough, hemoptysis, shortness of breath and wheezing.   Cardiovascular: Negative.  Negative for chest pain, palpitations and leg swelling.  Gastrointestinal: Negative.  Negative for abdominal pain, blood in stool, diarrhea, nausea and vomiting.  Genitourinary: Negative.  Negative for dysuria.  Musculoskeletal: Negative.  Negative for back pain.  Skin: Negative.   Neurological: Negative for dizziness, tremors, speech change, focal weakness, seizures and headaches.  Endo/Heme/Allergies: Negative.  Does not bruise/bleed easily.  Psychiatric/Behavioral: Negative.  Negative for  depression, hallucinations and suicidal ideas.     PHYSICAL EXAMINATION:  GENERAL:  63 y.o.-year-old patient lying in the bed with no acute distress.  NECK:  Supple, no jugular venous distention. No thyroid enlargement, no tenderness.  LUNGS: Normal breath sounds bilaterally, no wheezing, rales,rhonchi  No use of accessory muscles of respiration.  CARDIOVASCULAR: S1, S2 normal. No murmurs, rubs, or gallops.  ABDOMEN: Soft, non-tender, non-distended. Bowel sounds present. No organomegaly or mass.  EXTREMITIES: No pedal edema, cyanosis, or clubbing.  PSYCHIATRIC: The patient is alert and oriented x 3.  SKIN: No obvious rash, lesion, or ulcer.   DATA REVIEW:   CBC Recent Labs  Lab 06/12/18 0434  WBC 11.1*  HGB 10.6*  HCT 34.6*  PLT 361    Chemistries  Recent Labs  Lab 06/10/18 1645  06/12/18 0434  NA 134*   < > 139  K 3.9   < > 3.9  CL 99   < > 107  CO2 21*   < >  22  GLUCOSE 111*   < > 133*  BUN 34*   < > 25*  CREATININE 3.01*   < > 1.77*  CALCIUM 8.4*   < > 8.2*  AST 16  --   --   ALT 14  --   --   ALKPHOS 96  --   --   BILITOT 0.7  --   --    < > = values in this interval not displayed.    Cardiac Enzymes Recent Labs  Lab 06/10/18 1645  TROPONINI <0.03    Microbiology Results  @[email or web address] @  RADIOLOGY:  Ct Abdomen Pelvis Wo Contrast  Result Date: 06/10/2018 CLINICAL DATA:  Fatigue. Abdominal pain. Symptoms for 30 days. Nausea and vomiting. Frequent bowel movements. Left lower quadrant pain. EXAM: CT ABDOMEN AND PELVIS WITHOUT CONTRAST TECHNIQUE: Multidetector CT imaging of the abdomen and pelvis was performed following the standard protocol without IV contrast. COMPARISON:  12/15/2008 FINDINGS: Lower chest: Clear lung bases. Normal heart size without pericardial or pleural effusion. Hepatobiliary: Moderate hepatic steatosis. Normal liver. Cholecystectomy, without biliary ductal dilatation. Pancreas: Normal, without mass or ductal dilatation. Spleen: Normal in  size, without focal abnormality. Adrenals/Urinary Tract: Normal adrenal glands. No renal calculi or hydronephrosis. The ureters are difficult to follow. No bladder calculi. Stomach/Bowel: Normal stomach, without wall thickening. Scattered colonic diverticula. Normal terminal ileum. Normal small bowel. Vascular/Lymphatic: Aortic and branch vessel atherosclerosis. No abdominopelvic adenopathy. Reproductive: Hysterectomy.  No adnexal mass. Other: No significant free fluid. Right abdominal wall surgical sutures. Musculoskeletal: Lumbosacral spondylosis. IMPRESSION: 1. No acute process in the abdomen or pelvis. No explanation for patient's symptoms. 2. Hepatic steatosis. 3.  Aortic Atherosclerosis (ICD10-I70.0). Electronically Signed   By: Abigail Miyamoto M.D.   On: 06/10/2018 16:34   Dg Chest 1 View  Result Date: 06/10/2018 CLINICAL DATA:  63 year old female with leukocytosis, abdominal pain. EXAM: CHEST  1 VIEW COMPARISON:  CT Abdomen and Pelvis earlier today. Portable chest 05/12/2018. FINDINGS: Portable AP upright view at 2249 hours. Lung volumes and mediastinal contours are within normal limits. Visualized tracheal air column is within normal limits. Allowing for portable technique the lungs are clear. No pneumothorax or pneumoperitoneum identified. No acute osseous abnormality identified. IMPRESSION: Negative portable chest. Electronically Signed   By: Genevie Ann M.D.   On: 06/10/2018 23:09   US Renal  Result Date: 06/11/2018 CLINICAL DATA:  Acute renal failure EXAM: RENAL / URINARY TRACT ULTRASOUND COMPLETE COMPARISON:  CT abdomen pelvis 06/10/2018 FINDINGS: Right Kidney: Renal measurements: 11.3 x 4.9 x 5.2 cm = volume: 148.6 mL. Increased renal cortical echogenicity. No mass or hydronephrosis visualized. Left Kidney: Renal measurements: 10.1 x 5.8 x 4.1 cm = volume: 127.1 mL. Increased renal cortical echogenicity. No mass or hydronephrosis visualized. Within the midpole of the left kidney there is a 2.4 x 2.6  x 1.7 cm cystic lesion. Bladder: Appears normal for degree of bladder distention. IMPRESSION: No hydronephrosis. Increased renal cortical echogenicity as can be seen with chronic medical renal disease. Electronically Signed   By: Lovey Newcomer M.D.   On: 06/11/2018 11:02      Allergies as of 06/12/2018      Reactions   Asa [aspirin] Nausea And Vomiting   Other reaction(s): Other (See Comments) Tremors, vertigo dizziness   Celecoxib    Other reaction(s): Dizziness   Clemastine    Other reaction(s): Unknown   Limonene    Other reaction(s): Nausea And Vomiting dizziness   Lovastatin Other (See Comments)   Made her feel  like she is in trance/suicidal    Oxycodone-acetaminophen Other (See Comments)   Percocet [oxycodone-acetaminophen]    Other reaction(s): Hallucination hallucinations      Medication List    STOP taking these medications   naproxen 500 MG tablet Commonly known as:  NAPROSYN     TAKE these medications   allopurinol 300 MG tablet Commonly known as:  ZYLOPRIM Take 300 mg by mouth daily.   atenolol 50 MG tablet Commonly known as:  TENORMIN Take 50 mg by mouth daily.   BIOTIN PO Take 1 tablet by mouth daily.   cyclobenzaprine 10 MG tablet Commonly known as:  FLEXERIL take 1 tablet by mouth twice a day for MUSCLES   FERGON PO Take 2 tablets by mouth daily.   furosemide 20 MG tablet Commonly known as:  LASIX Take 20 mg by mouth daily.   gabapentin 100 MG capsule Commonly known as:  NEURONTIN Take 200 mg by mouth 2 (two) times daily.   levothyroxine 150 MCG tablet Commonly known as:  SYNTHROID Take 150 mcg by mouth daily.   magnesium oxide 400 MG tablet Commonly known as:  MAG-OX Take 400 mg by mouth daily.   metFORMIN 500 MG tablet Commonly known as:  GLUCOPHAGE Take 500 mg by mouth 2 (two) times daily with a meal.   pantoprazole 40 MG tablet Commonly known as:  PROTONIX Take 40 mg by mouth daily.   pravastatin 20 MG tablet Commonly known  as:  PRAVACHOL Take 20 mg by mouth Nightly.   RA Vitamin B-12 TR 1000 MCG Tbcr Generic drug:  Cyanocobalamin Take by mouth. Reported on 05/24/2015   telmisartan-hydrochlorothiazide 40-12.5 MG tablet Commonly known as:  MICARDIS HCT Take 1 tablet by mouth daily.   traMADol 50 MG tablet Commonly known as:  ULTRAM Take 50 mg by mouth every 6 (six) hours as needed for moderate pain.   Vitamin D-1000 Max St 25 MCG (1000 UT) tablet Generic drug:  Cholecalciferol Take by mouth.   Vitron-C 65-125 MG Tabs Generic drug:  Iron-Vitamin C Take 1 tablet by mouth daily.   zolpidem 10 MG tablet Commonly known as:  AMBIEN Take 10 mg by mouth at bedtime.          Management plans discussed with the patient and she is in agreement. Stable for discharge home  Patient should follow up with pcp  CODE STATUS:     Code Status Orders  (From admission, onward)         Start     Ordered   06/10/18 2244  Full code  Continuous     06/10/18 2243        Code Status History    This patient has a current code status but no historical code status.      TOTAL TIME TAKING CARE OF THIS PATIENT: 38 minutes.    Note: This dictation was prepared with Dragon dictation along with smaller phrase technology. Any transcriptional errors that result from this process are unintentional.  Bettey Costa M.D on 06/12/2018 at 12:34 PM  Between 7am to 6pm - Pager - [phone] After 6pm go to [email or web address] - password [email or web address] [facility]  Office  [phone]  CC: Primary care physician; Rusty Aus, MD

## 2018-06-12 NOTE — Anesthesia Post-op Follow-up Note (Signed)
Anesthesia QCDR form completed.        

## 2018-06-13 ENCOUNTER — Encounter: Payer: Self-pay | Admitting: Gastroenterology

## 2018-06-13 LAB — SURGICAL PATHOLOGY

## 2018-06-16 ENCOUNTER — Encounter: Payer: Self-pay | Admitting: Gastroenterology

## 2018-06-17 DIAGNOSIS — M1A00X0 Idiopathic chronic gout, unspecified site, without tophus (tophi): Secondary | ICD-10-CM | POA: Diagnosis not present

## 2018-06-17 DIAGNOSIS — N289 Disorder of kidney and ureter, unspecified: Secondary | ICD-10-CM | POA: Diagnosis not present

## 2018-06-17 DIAGNOSIS — K922 Gastrointestinal hemorrhage, unspecified: Secondary | ICD-10-CM | POA: Diagnosis not present

## 2018-06-17 DIAGNOSIS — E118 Type 2 diabetes mellitus with unspecified complications: Secondary | ICD-10-CM | POA: Diagnosis not present

## 2018-06-18 ENCOUNTER — Other Ambulatory Visit: Payer: Self-pay

## 2018-06-18 DIAGNOSIS — K219 Gastro-esophageal reflux disease without esophagitis: Secondary | ICD-10-CM

## 2018-06-19 ENCOUNTER — Telehealth: Payer: Self-pay | Admitting: Gastroenterology

## 2018-06-19 NOTE — Telephone Encounter (Signed)
Pt is calling she received a call from [place]? Please returning a call she is aware to answer a private call from nurse

## 2018-06-23 NOTE — Telephone Encounter (Signed)
Claudia Hunter from [facility] calling regarding  pt procedure 06/24/18 needing authorization please call [phone] ext [phone]

## 2018-06-24 ENCOUNTER — Ambulatory Visit: Payer: 59 | Attending: Gastroenterology

## 2018-06-24 NOTE — Telephone Encounter (Signed)
Abby with [facility] called to let us now patient did not come for her Gastric emptying study.

## 2018-06-24 NOTE — Telephone Encounter (Signed)
Pt has been notified of misses appt and she will call central scheduling and reschedule appt

## 2018-07-31 ENCOUNTER — Other Ambulatory Visit: Payer: Self-pay

## 2018-07-31 ENCOUNTER — Encounter
Admission: RE | Admit: 2018-07-31 | Discharge: 2018-07-31 | Disposition: A | Discharge status: Home / Self Care | Payer: 59 | Source: Ambulatory Visit | Attending: Gastroenterology | Admitting: Gastroenterology

## 2018-07-31 DIAGNOSIS — K219 Gastro-esophageal reflux disease without esophagitis: Secondary | ICD-10-CM | POA: Diagnosis present

## 2018-07-31 MED ORDER — TECHNETIUM TC 99M SULFUR COLLOID
3.1800 | Freq: Once | INTRAVENOUS | Status: AC | PRN
  Administered 2018-07-31: 3.18 via ORAL

## 2019-02-20 ENCOUNTER — Other Ambulatory Visit: Payer: Self-pay | Admitting: Internal Medicine

## 2019-02-20 DIAGNOSIS — Z1231 Encounter for screening mammogram for malignant neoplasm of breast: Secondary | ICD-10-CM

## 2019-03-18 ENCOUNTER — Ambulatory Visit
Admission: RE | Admit: 2019-03-18 | Discharge: 2019-03-18 | Disposition: A | Discharge status: Home / Self Care | Payer: 59 | Source: Ambulatory Visit | Attending: Internal Medicine | Admitting: Internal Medicine

## 2019-03-18 DIAGNOSIS — Z1231 Encounter for screening mammogram for malignant neoplasm of breast: Secondary | ICD-10-CM | POA: Diagnosis not present

## 2020-09-29 IMAGING — MG DIGITAL SCREENING BILAT W/ TOMO W/ CAD
8 series · 8 of 24 positions shown · non-contrast
Comparison: Previous exam(s).

CLINICAL DATA: Screening.

EXAM:
DIGITAL SCREENING BILATERAL MAMMOGRAM WITH TOMO AND CAD

[R CC synth-2D]
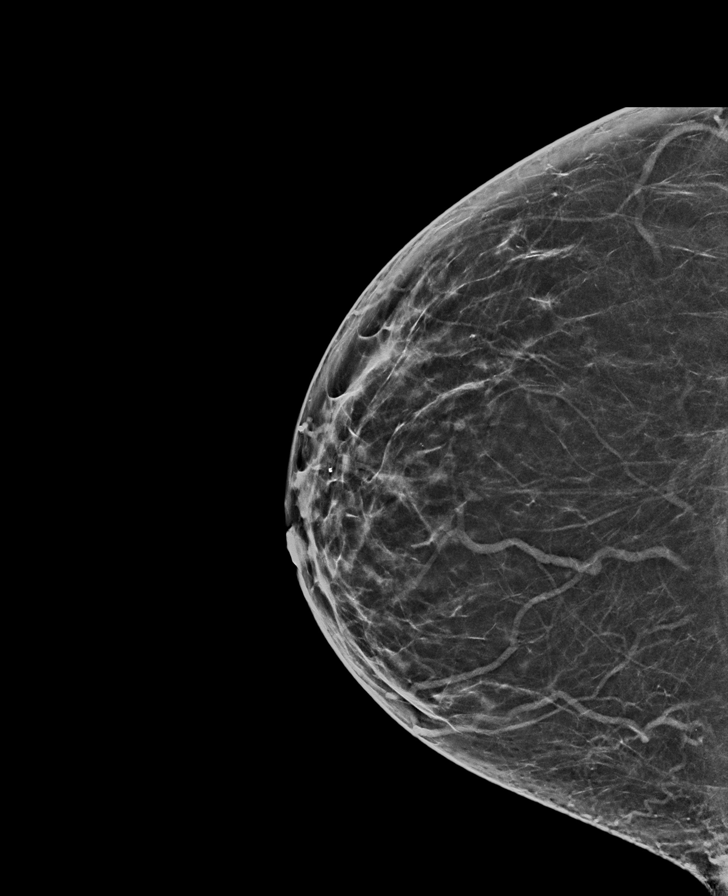

[R MLO synth-2D]
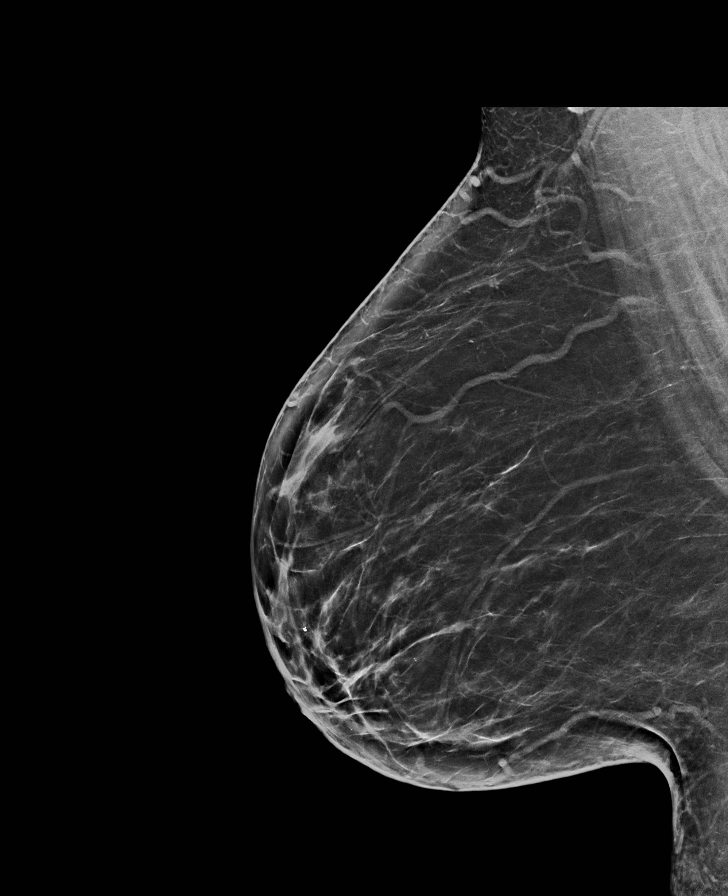

[L MLO synth-2D]
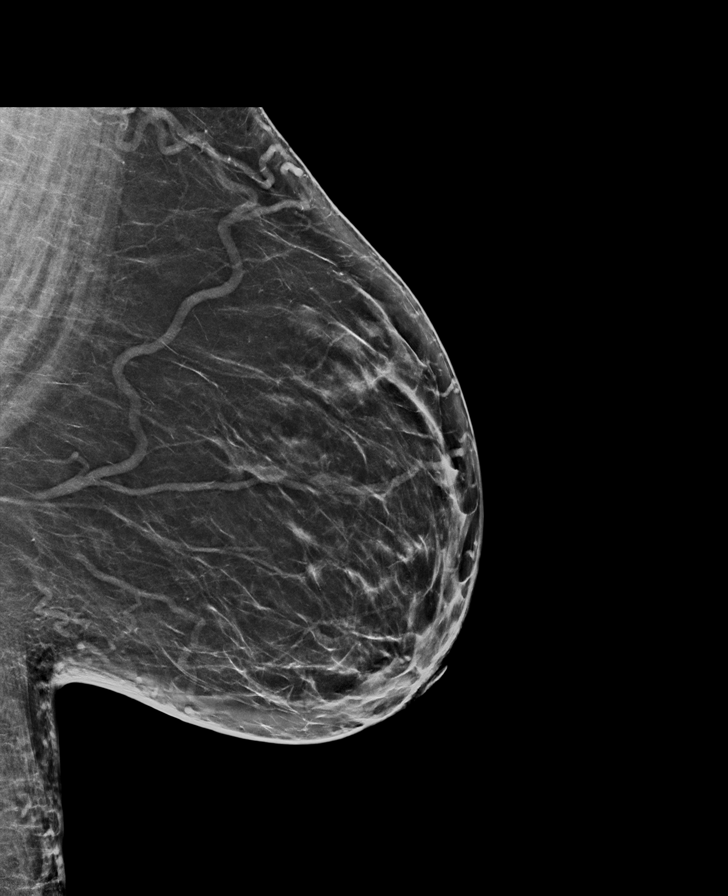

[L CC synth-2D]
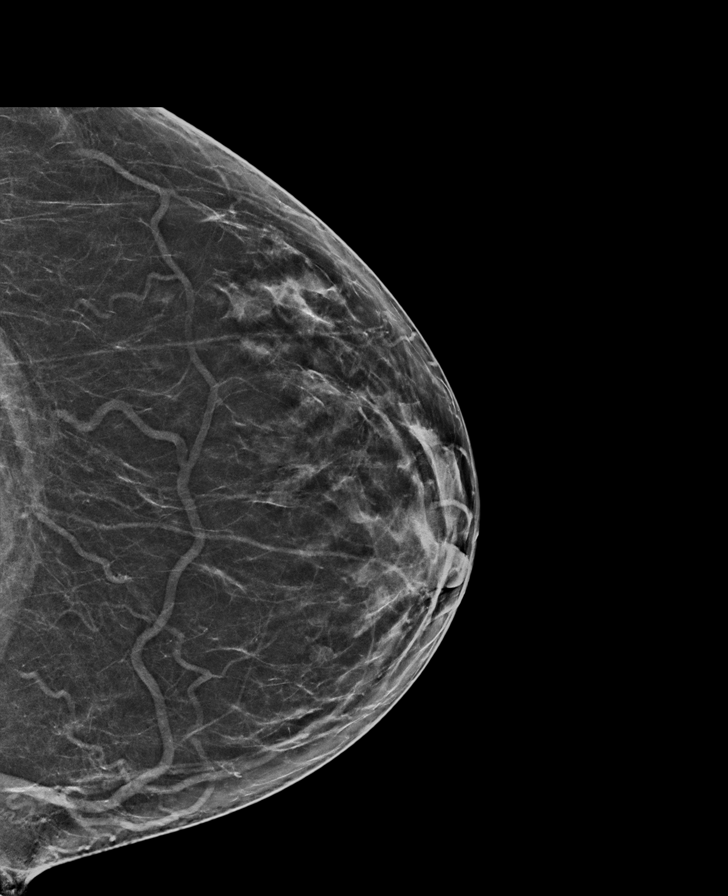

[L CC tomo · tomo slice 33/66.0]
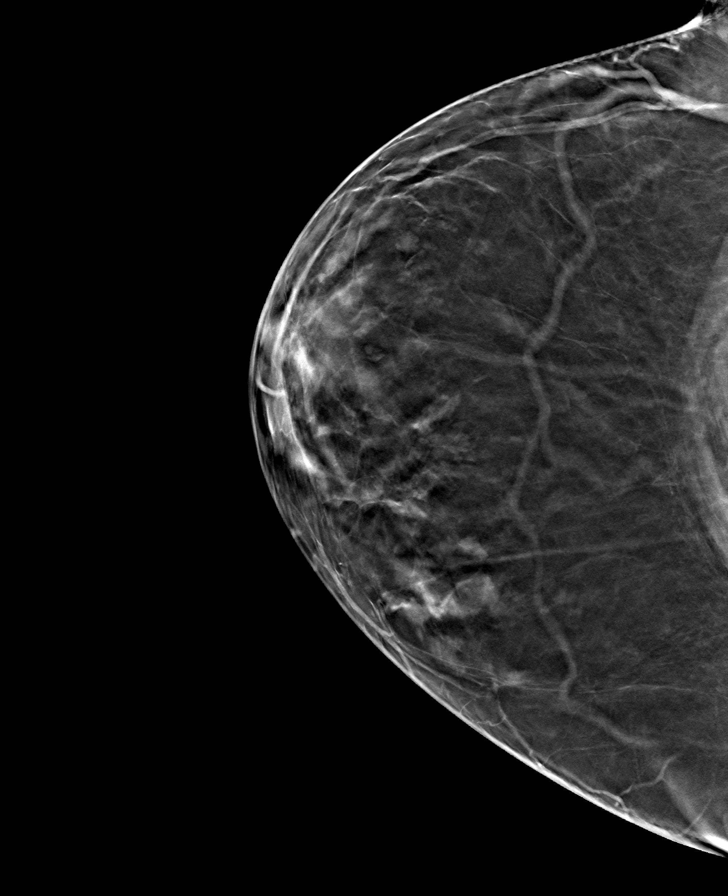

[R CC tomo · tomo slice 33/65.0]
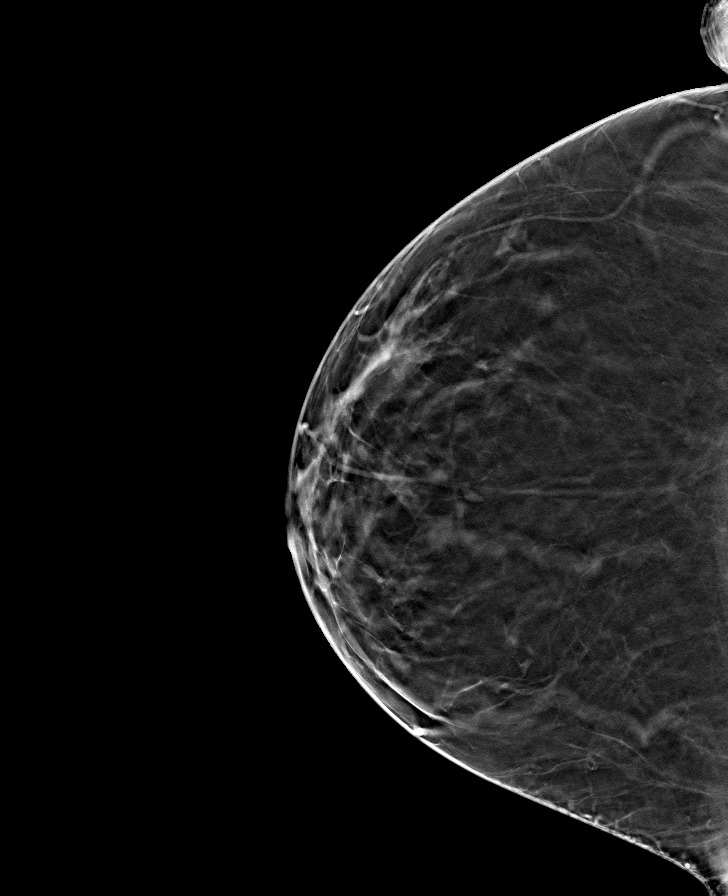

[L MLO tomo · tomo slice 38/75.0]
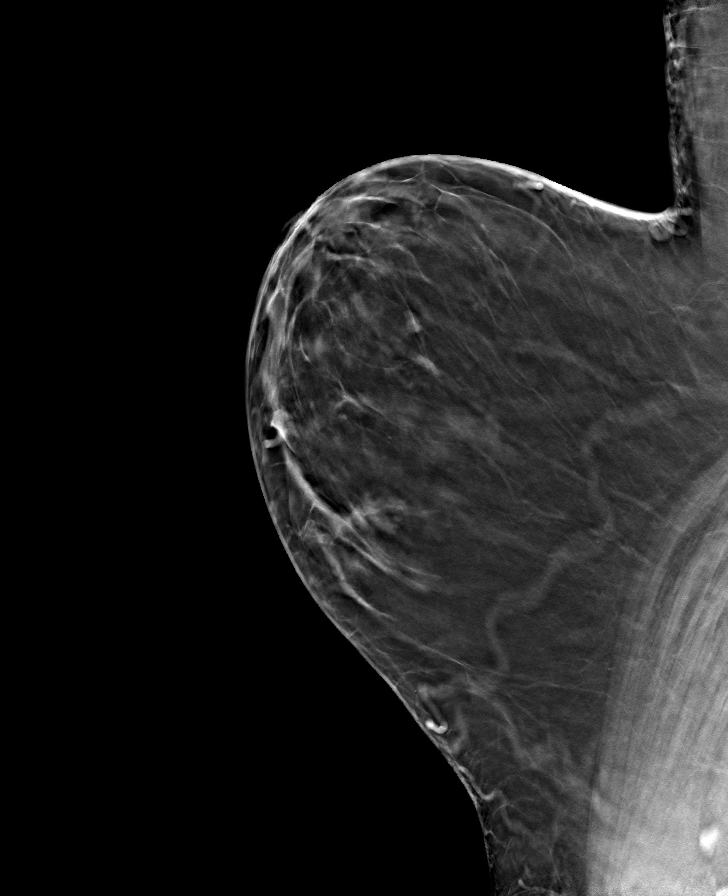

[R MLO tomo · tomo slice 38/75.0]
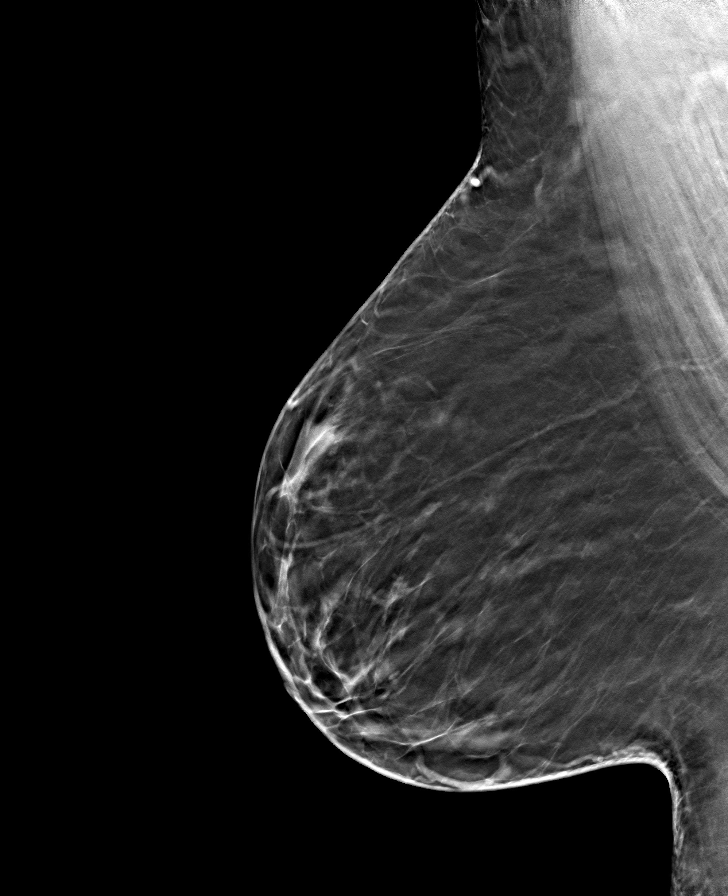

[8 of 24 positions shown; findings below may reference images not displayed]

ACR Breast Density Category b: There are scattered areas of
fibroglandular density.
FINDINGS: There are no findings suspicious for malignancy. Images were
processed with CAD.
IMPRESSION: No mammographic evidence of malignancy. A result letter of this
screening mammogram will be mailed directly to the patient.

RECOMMENDATION:
Screening mammogram in one year. (Code:[id number])

BI-RADS CATEGORY  1: Negative.

## 2022-12-31 ENCOUNTER — Encounter: Payer: Self-pay | Admitting: Ophthalmology

## 2023-01-01 ENCOUNTER — Encounter: Payer: Self-pay | Admitting: Ophthalmology

## 2023-01-01 NOTE — Anesthesia Preprocedure Evaluation (Signed)
Anesthesia Evaluation  Patient identified by MRN, date of birth, ID band Patient awake    Reviewed: Allergy & Precautions, H&P , NPO status , Patient's Chart, lab work & pertinent test results  Airway Mallampati: III  TM Distance: >3 FB Neck ROM: Full    Dental no notable dental hx. (+) Partial Upper   Pulmonary neg pulmonary ROS   Pulmonary exam normal breath sounds clear to auscultation       Cardiovascular hypertension, negative cardio ROS Normal cardiovascular exam Rhythm:Regular Rate:Normal     Neuro/Psych  Neuromuscular disease negative neurological ROS  negative psych ROS   GI/Hepatic negative GI ROS, Neg liver ROS,GERD  ,,  Endo/Other  negative endocrine ROSdiabetesHypothyroidism    Renal/GU Renal diseasenegative Renal ROS  negative genitourinary   Musculoskeletal negative musculoskeletal ROS (+) Arthritis ,    Abdominal   Peds negative pediatric ROS (+)  Hematology negative hematology ROS (+)   Anesthesia Other Findings Hypertension Type II DM with stage IV CKD Gout  Neuropathy Diverticulitis  Pinched nerve CKD (chronic kidney disease), stage IV GERD (gastroesophageal reflux disease) Wears dentures     Reproductive/Obstetrics negative OB ROS                              Anesthesia Physical Anesthesia Plan  ASA: 3  Anesthesia Plan: MAC   Post-op Pain Management:    Induction: Intravenous  PONV Risk Score and Plan:   Airway Management Planned: Natural Airway and Nasal Cannula  Additional Equipment:   Intra-op Plan:   Post-operative Plan:   Informed Consent: I have reviewed the patients History and Physical, chart, labs and discussed the procedure including the risks, benefits and alternatives for the proposed anesthesia with the patient or authorized representative who has indicated his/her understanding and acceptance.     Dental Advisory Given  Plan  Discussed with: Anesthesiologist, CRNA and Surgeon  Anesthesia Plan Comments: (Patient consented for risks of anesthesia including but not limited to:  - adverse reactions to medications - damage to eyes, teeth, lips or other oral mucosa - nerve damage due to positioning  - sore throat or hoarseness - Damage to heart, brain, nerves, lungs, other parts of body or loss of life  Patient voiced understanding and assent.)         Anesthesia Quick Evaluation

## 2023-01-01 NOTE — Discharge Instructions (Signed)

## 2023-01-03 ENCOUNTER — Encounter: Payer: Self-pay | Admitting: Ophthalmology

## 2023-01-03 ENCOUNTER — Encounter
Admission: RE | Disposition: A | Discharge status: Home / Self Care | Payer: Self-pay | Source: Home / Self Care | Attending: Ophthalmology

## 2023-01-03 ENCOUNTER — Ambulatory Visit: Payer: Medicare Other | Admitting: Anesthesiology

## 2023-01-03 ENCOUNTER — Other Ambulatory Visit: Payer: Self-pay

## 2023-01-03 ENCOUNTER — Ambulatory Visit
Admission: RE | Admit: 2023-01-03 | Discharge: 2023-01-03 | Disposition: A | Discharge status: Home / Self Care | Payer: Medicare Other | Attending: Ophthalmology | Admitting: Ophthalmology

## 2023-01-03 DIAGNOSIS — I129 Hypertensive chronic kidney disease with stage 1 through stage 4 chronic kidney disease, or unspecified chronic kidney disease: Secondary | ICD-10-CM | POA: Insufficient documentation

## 2023-01-03 DIAGNOSIS — M109 Gout, unspecified: Secondary | ICD-10-CM | POA: Diagnosis not present

## 2023-01-03 DIAGNOSIS — Z7985 Long-term (current) use of injectable non-insulin antidiabetic drugs: Secondary | ICD-10-CM | POA: Diagnosis not present

## 2023-01-03 DIAGNOSIS — E1136 Type 2 diabetes mellitus with diabetic cataract: Secondary | ICD-10-CM | POA: Insufficient documentation

## 2023-01-03 DIAGNOSIS — E1122 Type 2 diabetes mellitus with diabetic chronic kidney disease: Secondary | ICD-10-CM | POA: Diagnosis not present

## 2023-01-03 DIAGNOSIS — E039 Hypothyroidism, unspecified: Secondary | ICD-10-CM | POA: Insufficient documentation

## 2023-01-03 DIAGNOSIS — Z7984 Long term (current) use of oral hypoglycemic drugs: Secondary | ICD-10-CM | POA: Diagnosis not present

## 2023-01-03 DIAGNOSIS — N184 Chronic kidney disease, stage 4 (severe): Secondary | ICD-10-CM | POA: Diagnosis not present

## 2023-01-03 DIAGNOSIS — K219 Gastro-esophageal reflux disease without esophagitis: Secondary | ICD-10-CM | POA: Insufficient documentation

## 2023-01-03 DIAGNOSIS — E1140 Type 2 diabetes mellitus with diabetic neuropathy, unspecified: Secondary | ICD-10-CM | POA: Insufficient documentation

## 2023-01-03 DIAGNOSIS — H2512 Age-related nuclear cataract, left eye: Secondary | ICD-10-CM | POA: Diagnosis not present

## 2023-01-03 HISTORY — DX: Gastro-esophageal reflux disease without esophagitis: K21.9

## 2023-01-03 HISTORY — DX: Type 2 diabetes mellitus with diabetic chronic kidney disease: E11.22

## 2023-01-03 HISTORY — PX: CATARACT EXTRACTION W/PHACO: SHX586

## 2023-01-03 HISTORY — DX: Chronic kidney disease, stage 3 unspecified: N18.30

## 2023-01-03 HISTORY — DX: Mononeuropathy, unspecified: G58.9

## 2023-01-03 HISTORY — DX: Presence of dental prosthetic device (complete) (partial): Z97.2

## 2023-01-03 LAB — GLUCOSE, CAPILLARY: Glucose-Capillary: 112 mg/dL — ABNORMAL HIGH (ref 70–99)

## 2023-01-03 SURGERY — PHACOEMULSIFICATION, CATARACT, WITH IOL INSERTION
Anesthesia: Monitor Anesthesia Care | Site: Eye | Laterality: Left

## 2023-01-03 MED ORDER — ONDANSETRON HCL 4 MG/2ML IJ SOLN
INTRAMUSCULAR | Status: AC
  Filled 2023-01-03: qty 2

## 2023-01-03 MED ORDER — ARMC OPHTHALMIC DILATING DROPS
OPHTHALMIC | Status: AC
  Filled 2023-01-03: qty 0.5

## 2023-01-03 MED ORDER — SIGHTPATH DOSE#1 NA HYALUR & NA CHOND-NA HYALUR IO KIT
PACK | INTRAOCULAR | Status: DC | PRN
  Administered 2023-01-03: 1 via OPHTHALMIC

## 2023-01-03 MED ORDER — MOXIFLOXACIN HCL 0.5 % OP SOLN
OPHTHALMIC | Status: DC | PRN
  Administered 2023-01-03: .2 mL via OPHTHALMIC

## 2023-01-03 MED ORDER — GLYCOPYRROLATE 0.2 MG/ML IJ SOLN
INTRAMUSCULAR | Status: AC
  Filled 2023-01-03: qty 1

## 2023-01-03 MED ORDER — PROPOFOL 10 MG/ML IV BOLUS
INTRAVENOUS | Status: AC
  Filled 2023-01-03: qty 20

## 2023-01-03 MED ORDER — LIDOCAINE HCL (PF) 2 % IJ SOLN
INTRAOCULAR | Status: DC | PRN
  Administered 2023-01-03: 4 mL via INTRAOCULAR

## 2023-01-03 MED ORDER — FENTANYL CITRATE (PF) 100 MCG/2ML IJ SOLN
INTRAMUSCULAR | Status: AC
  Filled 2023-01-03: qty 2

## 2023-01-03 MED ORDER — SIGHTPATH DOSE#1 BSS IO SOLN
INTRAOCULAR | Status: DC | PRN
  Administered 2023-01-03: 67 mL via OPHTHALMIC

## 2023-01-03 MED ORDER — TETRACAINE HCL 0.5 % OP SOLN
OPHTHALMIC | Status: AC
  Filled 2023-01-03: qty 4

## 2023-01-03 MED ORDER — MIDAZOLAM HCL 2 MG/2ML IJ SOLN
INTRAMUSCULAR | Status: AC
Start: 2023-01-03 — End: ?
  Filled 2023-01-03: qty 2

## 2023-01-03 MED ORDER — FENTANYL CITRATE (PF) 100 MCG/2ML IJ SOLN
INTRAMUSCULAR | Status: DC | PRN
  Administered 2023-01-03: 50 ug via INTRAVENOUS

## 2023-01-03 MED ORDER — BRIMONIDINE TARTRATE-TIMOLOL 0.2-0.5 % OP SOLN
OPHTHALMIC | Status: DC | PRN
  Administered 2023-01-03: 1 [drp] via OPHTHALMIC

## 2023-01-03 MED ORDER — MIDAZOLAM HCL 2 MG/2ML IJ SOLN
INTRAMUSCULAR | Status: DC | PRN
  Administered 2023-01-03 (×2): 1 mg via INTRAVENOUS

## 2023-01-03 MED ORDER — SIGHTPATH DOSE#1 BSS IO SOLN
INTRAOCULAR | Status: DC | PRN
  Administered 2023-01-03: 15 mL via INTRAOCULAR

## 2023-01-03 MED ORDER — ARMC OPHTHALMIC DILATING DROPS
1.0000 | OPHTHALMIC | Status: DC | PRN
  Administered 2023-01-03 (×3): 1 via OPHTHALMIC

## 2023-01-03 MED ORDER — TETRACAINE HCL 0.5 % OP SOLN
1.0000 [drp] | OPHTHALMIC | Status: DC | PRN
  Administered 2023-01-03 (×3): 1 [drp] via OPHTHALMIC

## 2023-01-03 SURGICAL SUPPLY — 21 items
BNDG EYE OVAL 2 1/8 X 2 5/8 (GAUZE/BANDAGES/DRESSINGS) IMPLANT
CANNULA ANT/CHMB 27G (MISCELLANEOUS) IMPLANT
CANNULA ANT/CHMB 27GA (MISCELLANEOUS)
CATARACT SUITE SIGHTPATH (MISCELLANEOUS) ×1
DISSECTOR HYDRO NUCLEUS 50X22 (MISCELLANEOUS) ×1 IMPLANT
DRSG TEGADERM 2-3/8X2-3/4 SM (GAUZE/BANDAGES/DRESSINGS) ×1 IMPLANT
FEE CATARACT SUITE SIGHTPATH (MISCELLANEOUS) ×1 IMPLANT
GLOVE SURG SYN 7.5 E (GLOVE) ×1
GLOVE SURG SYN 7.5 PF PI (GLOVE) ×1 IMPLANT
GLOVE SURG SYN 8.5 E (GLOVE) ×1
GLOVE SURG SYN 8.5 PF PI (GLOVE) ×1 IMPLANT
LENS IOL RAYNER 22.5 (Intraocular Lens) ×1 IMPLANT
LENS IOL RAYONE EMV 22.5 (Intraocular Lens) IMPLANT
NDL FILTER BLUNT 18X1 1/2 (NEEDLE) IMPLANT
NEEDLE FILTER BLUNT 18X1 1/2 (NEEDLE)
PACK VIT ANT 23G (MISCELLANEOUS) IMPLANT
RING MALYGIN 7.0 (MISCELLANEOUS) IMPLANT
SUT ETHILON 10-0 CS-B-6CS-B-6 (SUTURE)
SUTURE EHLN 10-0 CS-B-6CS-B-6 (SUTURE) IMPLANT
SYR 3ML LL SCALE MARK (SYRINGE) IMPLANT
SYR 5ML LL (SYRINGE) IMPLANT

## 2023-01-03 NOTE — Op Note (Signed)
OPERATIVE NOTE  Claudia Hunter [id number] 01/03/2023   PREOPERATIVE DIAGNOSIS: Nuclear sclerotic cataract left eye. H25.12   POSTOPERATIVE DIAGNOSIS: Nuclear sclerotic cataract left eye. H25.12   PROCEDURE:  Phacoemusification with posterior chamber intraocular lens placement of the left eye  Ultrasound time: Procedure(s): CATARACT EXTRACTION PHACO AND INTRAOCULAR LENS PLACEMENT (IOC) LEFT DIABETIC 4.75 00:35.0 (Left)  LENS:   Implant Name Type Inv. Item Serial No. Manufacturer Lot No. LRB No. Used Action  LENS IOL RAYNER 22.5 - [id number] Intraocular Lens LENS IOL RAYNER 22.5  [employer] [id number] Left 1 Implanted      SURGEON:  Julious Payer. Rolley Sims, MD   ANESTHESIA:  Topical with tetracaine drops, augmented with 1% preservative-free intracameral lidocaine.   COMPLICATIONS:  None.   DESCRIPTION OF PROCEDURE:  The patient was identified in the holding room and transported to the operating room and placed in the supine position under the operating microscope.  The left eye was identified as the operative eye, which was prepped and draped in the usual sterile ophthalmic fashion.   A 1 millimeter clear-corneal paracentesis was made inferotemporally. Preservative-free 1% lidocaine mixed with 1:1,000 bisulfite-free aqueous solution of epinephrine was injected into the anterior chamber. The anterior chamber was then filled with Viscoat viscoelastic. A 2.4 millimeter keratome was used to make a clear-corneal incision superotemporally. A curvilinear capsulorrhexis was made with a cystotome and capsulorrhexis forceps. Balanced salt solution was used to hydrodissect and hydrodelineate the nucleus. Phacoemulsification was then used to remove the lens nucleus and epinucleus. The remaining cortex was then removed using the irrigation and aspiration handpiece. Provisc was then placed into the capsular bag to distend it for lens placement. A +22.50 D [id number] intraocular lens was then injected into the  capsular bag. The remaining viscoelastic was aspirated.   Wounds were hydrated with balanced salt solution.  The anterior chamber was inflated to a physiologic pressure with balanced salt solution.  No wound leaks were noted. Vigamox was injected intracamerally.  Timolol and Brimonidine drops were applied to the eye.  The patient was taken to the recovery room in stable condition without complications of anesthesia or surgery.  Rolly Pancake Sequoyah 01/03/2023, 8:20 AM

## 2023-01-03 NOTE — Anesthesia Postprocedure Evaluation (Signed)
Anesthesia Post Note  Patient: BRINDLEY DALTON  Procedure(s) Performed: CATARACT EXTRACTION PHACO AND INTRAOCULAR LENS PLACEMENT (IOC) LEFT DIABETIC 4.75 00:35.0 (Left: Eye)  Patient location during evaluation: PACU Anesthesia Type: MAC Level of consciousness: awake and alert Pain management: pain level controlled Vital Signs Assessment: post-procedure vital signs reviewed and stable Respiratory status: spontaneous breathing, nonlabored ventilation, respiratory function stable and patient connected to nasal cannula oxygen Cardiovascular status: stable and blood pressure returned to baseline Postop Assessment: no apparent nausea or vomiting Anesthetic complications: no   No notable events documented.   Last Vitals:  Vitals:   01/03/23 0821 01/03/23 0827  BP: (!) 150/81 (!) 137/96  Pulse: 83 79  Resp:  (!) 23  Temp: (!) 35.7 C (!) 36.4 C  SpO2: 100% 100%    Last Pain:  Vitals:   01/03/23 0827  TempSrc:   PainSc: 0-No pain                 Jermar Colter C Destani Wamser

## 2023-01-03 NOTE — Transfer of Care (Signed)
Immediate Anesthesia Transfer of Care Note  Patient: Claudia Hunter  Procedure(s) Performed: CATARACT EXTRACTION PHACO AND INTRAOCULAR LENS PLACEMENT (IOC) LEFT DIABETIC 4.75 00:35.0 (Left: Eye)  Patient Location: PACU  Anesthesia Type:MAC  Level of Consciousness: awake, alert , and oriented  Airway & Oxygen Therapy: Patient Spontanous Breathing  Post-op Assessment: Report given to RN, Post -op Vital signs reviewed and stable, and Patient moving all extremities X 4  Post vital signs: Reviewed and stable  Last Vitals:  Vitals Value Taken Time  BP    Temp 35.7 C 01/03/23 0821  Pulse 83 01/03/23 0822  Resp    SpO2 100 % 01/03/23 0822  Vitals shown include unfiled device data.  Last Pain:  Vitals:   01/03/23 0705  TempSrc: Temporal  PainSc: 0-No pain         Complications: No notable events documented.

## 2023-01-03 NOTE — H&P (Signed)
[facility]   Primary Care Physician:  Danella Penton, MD Ophthalmologist: Dr. Deberah Pelton  Pre-Procedure History & Physical: HPI:  Claudia Hunter is a 68 y.o. female here for cataract surgery.   Past Medical History:  Diagnosis Date   CKD (chronic kidney disease), stage III (HCC)    Diverticulitis    GERD (gastroesophageal reflux disease)    Gout    Hypertension    Neuropathy    feet and hands   Pinched nerve    Thoracic   Type 2 diabetes mellitus with stage 4 chronic kidney disease (HCC)    Wears dentures    partial upper    Past Surgical History:  Procedure Laterality Date   ABDOMINAL HYSTERECTOMY     CESAREAN SECTION     CHOLECYSTECTOMY     COLONOSCOPY N/A 06/12/2018   Procedure: COLONOSCOPY;  Surgeon: Toney Reil, MD;  Location: [facility] ENDOSCOPY;  Service: Gastroenterology;  Laterality: N/A;   ESOPHAGOGASTRODUODENOSCOPY N/A 06/12/2018   Procedure: ESOPHAGOGASTRODUODENOSCOPY (EGD);  Surgeon: Toney Reil, MD;  Location: [facility] ENDOSCOPY;  Service: Gastroenterology;  Laterality: N/A;   OOPHORECTOMY Right     Prior to Admission medications   Medication Sig Start Date End Date Taking? Authorizing Provider  allopurinol (ZYLOPRIM) 300 MG tablet Take 200 mg by mouth daily.   Yes [provider]  amLODipine (NORVASC) 5 MG tablet Take 5 mg by mouth at bedtime.   Yes [provider]  atenolol (TENORMIN) 50 MG tablet Take 50 mg by mouth daily.   Yes [provider]  BIOTIN PO Take 1 tablet by mouth daily.   Yes [provider]  Cholecalciferol (VITAMIN D-1000 MAX ST) 1000 units tablet Take by mouth.   Yes [provider]  Cyanocobalamin (RA VITAMIN B-12 TR) 1000 MCG TBCR Take by mouth. Reported on 05/24/2015   Yes [provider]  cyclobenzaprine (FLEXERIL) 10 MG tablet at bedtime as needed. 04/15/15  Yes [provider]  dicyclomine (BENTYL) 20 MG tablet Take 20 mg by mouth 3 (three) times daily as  needed for spasms.   Yes [provider]  gabapentin (NEURONTIN) 100 MG capsule Take 200 mg by mouth 4 (four) times daily. 05/09/18  Yes [provider]  glipiZIDE (GLUCOTROL XL) 2.5 MG 24 hr tablet Take 2.5 mg by mouth daily with breakfast.   Yes [provider]  levothyroxine (SYNTHROID) 125 MCG tablet Take 125 mcg by mouth daily before breakfast.   Yes [provider]  magnesium oxide (MAG-OX) 400 MG tablet Take 400 mg by mouth daily.   Yes [provider]  pantoprazole (PROTONIX) 40 MG tablet Take 40 mg by mouth daily.   Yes [provider]  pravastatin (PRAVACHOL) 20 MG tablet Take 20 mg by mouth Nightly. 02/18/18 12/31/22 Yes [provider]  Semaglutide,0.25 or 0.5MG /DOS, 2 MG/3ML SOPN Inject 0.375 mLs into the skin once a week. Fridays   Yes [provider]  sertraline (ZOLOFT) 50 MG tablet Take 50 mg by mouth daily.   Yes [provider]  traMADol (ULTRAM) 50 MG tablet Take 50 mg by mouth 3 (three) times daily as needed for moderate pain (pain score 4-6).   Yes [provider]  zolpidem (AMBIEN) 10 MG tablet Take 10 mg by mouth at bedtime.  11/12/13  Yes [provider]    Allergies as of 12/14/2022 - Review Complete 07/31/2018  Allergen Reaction Noted   Asa [aspirin] Nausea And Vomiting 09/20/2013   Celecoxib  05/03/2015   Clemastine  05/03/2015   Limonene  05/03/2015   Lovastatin Other (See Comments) 02/18/2018   Oxycodone-acetaminophen Other (See Comments) 07/15/2013   Percocet [oxycodone-acetaminophen]  09/20/2013    Family History  Problem Relation Age of Onset   Cancer Mother    Heart disease Father    Breast cancer Maternal Aunt        great    Social History   Socioeconomic History   Marital status: Married    Spouse name: Not on file   Number of children: Not on file   Years of education: Not on file   Highest education level: Not on file  Occupational History    Not on file  Tobacco Use   Smoking status: Never   Smokeless tobacco: Never  Vaping Use   Vaping status: Never Used  Substance and Sexual Activity   Alcohol use: No   Drug use: Not on file   Sexual activity: Not on file  Other Topics Concern   Not on file  Social History Narrative   Not on file   Social Determinants of Health   Financial Resource Strain: High Risk (11/14/2022)   Received from [facility]   Overall Financial Resource Strain (CARDIA)    Difficulty of Paying Living Expenses: Hard  Food Insecurity: Food Insecurity Present (11/14/2022)   Received from [facility]   Hunger Vital Sign    Worried About Running Out of Food in the Last Year: Often true    Ran Out of Food in the Last Year: Sometimes true  Transportation Needs: Unmet Transportation Needs (11/14/2022)   Received from [facility] - Transportation    In the past 12 months, has lack of transportation kept you from medical appointments or from getting medications?: Yes    Lack of Transportation (Non-Medical): Yes  Physical Activity: Insufficiently Active (11/14/2022)   Received from [facility]   Exercise Vital Sign    Days of Exercise per Week: 7 days    Minutes of Exercise per Session: 10 min  Stress: Stress Concern Present (11/14/2022)   Received from [facility] - Occupational Stress Questionnaire    Feeling of Stress : Rather much  Social Connections: Moderately Isolated (11/14/2022)   Received from [facility]   Social Connection and Isolation Panel [NHANES]    Frequency of Communication with Friends and Family: More than three times a week    Frequency of Social Gatherings with Friends and Family: More than three times a week    Attends Religious Services: More than 4 times per year    Active Member of [employer] or Organizations: No    Attends Occupational hygienist Meetings: Never    Marital Status: Separated  Intimate Partner Violence: Not on file    Review of Systems: See HPI, otherwise negative ROS  Physical Exam: Ht 5\' 6"  (1.676 m)   Wt 120.7 kg   BMI 42.93 kg/m  General:   Alert, cooperative in NAD Head:  Normocephalic and atraumatic. Respiratory:  Normal work of breathing. Cardiovascular:  RRR  Impression/Plan: Claudia Hunter is here for cataract surgery.  Risks, benefits, limitations, and alternatives regarding cataract surgery have been reviewed with the patient.  Questions have been answered.  All parties agreeable.   Estanislado Pandy, MD  01/03/2023, 7:00 AM

## 2023-12-30 NOTE — Anesthesia Preprocedure Evaluation (Signed)
 Anesthesia Evaluation  Patient identified by MRN, date of birth, ID band Patient awake    Reviewed: Allergy & Precautions, H&P , NPO status , Patient's Chart, lab work & pertinent test results  Airway Mallampati: III  TM Distance: >3 FB Neck ROM: Full    Dental no notable dental hx. (+) Partial Upper   Pulmonary neg pulmonary ROS   Pulmonary exam normal breath sounds clear to auscultation       Cardiovascular hypertension, negative cardio ROS Normal cardiovascular exam Rhythm:Regular Rate:Normal     Neuro/Psych  Neuromuscular disease negative neurological ROS  negative psych ROS   GI/Hepatic negative GI ROS, Neg liver ROS,GERD  ,,  Endo/Other  negative endocrine ROSdiabetesHypothyroidism    Renal/GU Renal diseasenegative Renal ROS  negative genitourinary   Musculoskeletal negative musculoskeletal ROS (+) Arthritis ,    Abdominal   Peds negative pediatric ROS (+)  Hematology negative hematology ROS (+)   Anesthesia Other Findings Previous cataract surgery 01-03-23 Dr. Ola   Hypertension Type II DM with stage IV CKD Gout             Neuropathy Diverticulitis             Pinched nerve CKD (chronic kidney disease), stage IV GERD (gastroesophageal reflux disease) Wears dentures                 Reproductive/Obstetrics negative OB ROS                              Anesthesia Physical Anesthesia Plan  ASA: 3  Anesthesia Plan: MAC   Post-op Pain Management:    Induction: Intravenous  PONV Risk Score and Plan:   Airway Management Planned: Natural Airway and Nasal Cannula  Additional Equipment:   Intra-op Plan:   Post-operative Plan:   Informed Consent: I have reviewed the patients History and Physical, chart, labs and discussed the procedure including the risks, benefits and alternatives for the proposed anesthesia with the patient or authorized representative who has  indicated his/her understanding and acceptance.     Dental Advisory Given  Plan Discussed with: Anesthesiologist, CRNA and Surgeon  Anesthesia Plan Comments: (Patient consented for risks of anesthesia including but not limited to:  - adverse reactions to medications - damage to eyes, teeth, lips or other oral mucosa - nerve damage due to positioning  - sore throat or hoarseness - Damage to heart, brain, nerves, lungs, other parts of body or loss of life  Patient voiced understanding and assent.)         Anesthesia Quick Evaluation

## 2023-12-31 NOTE — Discharge Instructions (Signed)

## 2024-01-02 ENCOUNTER — Other Ambulatory Visit: Payer: Self-pay

## 2024-01-02 ENCOUNTER — Ambulatory Visit
Admission: RE | Admit: 2024-01-02 | Discharge: 2024-01-02 | Disposition: A | Discharge status: Home / Self Care | Attending: Ophthalmology | Admitting: Ophthalmology

## 2024-01-02 ENCOUNTER — Ambulatory Visit: Admitting: Anesthesiology

## 2024-01-02 ENCOUNTER — Encounter: Admission: RE | Disposition: A | Payer: Self-pay | Source: Home / Self Care | Attending: Ophthalmology

## 2024-01-02 ENCOUNTER — Encounter: Payer: Self-pay | Admitting: Ophthalmology

## 2024-01-02 DIAGNOSIS — N184 Chronic kidney disease, stage 4 (severe): Secondary | ICD-10-CM | POA: Insufficient documentation

## 2024-01-02 DIAGNOSIS — E1136 Type 2 diabetes mellitus with diabetic cataract: Secondary | ICD-10-CM | POA: Insufficient documentation

## 2024-01-02 DIAGNOSIS — Z79890 Hormone replacement therapy (postmenopausal): Secondary | ICD-10-CM | POA: Diagnosis not present

## 2024-01-02 DIAGNOSIS — E1140 Type 2 diabetes mellitus with diabetic neuropathy, unspecified: Secondary | ICD-10-CM | POA: Diagnosis not present

## 2024-01-02 DIAGNOSIS — I129 Hypertensive chronic kidney disease with stage 1 through stage 4 chronic kidney disease, or unspecified chronic kidney disease: Secondary | ICD-10-CM | POA: Insufficient documentation

## 2024-01-02 DIAGNOSIS — K219 Gastro-esophageal reflux disease without esophagitis: Secondary | ICD-10-CM | POA: Insufficient documentation

## 2024-01-02 DIAGNOSIS — H25011 Cortical age-related cataract, right eye: Secondary | ICD-10-CM | POA: Diagnosis present

## 2024-01-02 DIAGNOSIS — Z7984 Long term (current) use of oral hypoglycemic drugs: Secondary | ICD-10-CM | POA: Insufficient documentation

## 2024-01-02 DIAGNOSIS — H2511 Age-related nuclear cataract, right eye: Secondary | ICD-10-CM | POA: Diagnosis present

## 2024-01-02 DIAGNOSIS — E1122 Type 2 diabetes mellitus with diabetic chronic kidney disease: Secondary | ICD-10-CM | POA: Diagnosis not present

## 2024-01-02 DIAGNOSIS — E039 Hypothyroidism, unspecified: Secondary | ICD-10-CM | POA: Diagnosis not present

## 2024-01-02 HISTORY — PX: CATARACT EXTRACTION W/PHACO: SHX586

## 2024-01-02 LAB — GLUCOSE, CAPILLARY: Glucose-Capillary: 118 mg/dL — ABNORMAL HIGH (ref 70–99)

## 2024-01-02 SURGERY — PHACOEMULSIFICATION, CATARACT, WITH IOL INSERTION
Anesthesia: Monitor Anesthesia Care | Laterality: Right

## 2024-01-02 MED ORDER — SIGHTPATH DOSE#1 NA HYALUR & NA CHOND-NA HYALUR IO KIT
PACK | INTRAOCULAR | Status: DC | PRN
  Administered 2024-01-02: 1 via OPHTHALMIC

## 2024-01-02 MED ORDER — PHENYLEPHRINE HCL 10 % OP SOLN
1.0000 [drp] | OPHTHALMIC | Status: AC
  Administered 2024-01-02 (×3): 1 [drp] via OPHTHALMIC

## 2024-01-02 MED ORDER — PHENYLEPHRINE HCL 10 % OP SOLN
OPHTHALMIC | Status: AC
  Filled 2024-01-02: qty 5

## 2024-01-02 MED ORDER — LIDOCAINE HCL (PF) 2 % IJ SOLN
INTRAOCULAR | Status: DC | PRN
  Administered 2024-01-02: 4 mL via INTRAOCULAR

## 2024-01-02 MED ORDER — LACTATED RINGERS IV SOLN: INTRAVENOUS | Status: DC

## 2024-01-02 MED ORDER — TETRACAINE HCL 0.5 % OP SOLN
1.0000 [drp] | OPHTHALMIC | Status: DC | PRN
  Administered 2024-01-02 (×3): 1 [drp] via OPHTHALMIC

## 2024-01-02 MED ORDER — MIDAZOLAM HCL 2 MG/2ML IJ SOLN
INTRAMUSCULAR | Status: AC
  Filled 2024-01-02: qty 2

## 2024-01-02 MED ORDER — CYCLOPENTOLATE HCL 2 % OP SOLN
OPHTHALMIC | Status: AC
  Filled 2024-01-02: qty 2

## 2024-01-02 MED ORDER — FENTANYL CITRATE (PF) 100 MCG/2ML IJ SOLN
INTRAMUSCULAR | Status: AC
  Filled 2024-01-02: qty 2

## 2024-01-02 MED ORDER — BRIMONIDINE TARTRATE-TIMOLOL 0.2-0.5 % OP SOLN
OPHTHALMIC | Status: DC | PRN
Start: 2024-01-02 — End: 2024-01-02
  Administered 2024-01-02: 1 [drp] via OPHTHALMIC

## 2024-01-02 MED ORDER — MIDAZOLAM HCL (PF) 2 MG/2ML IJ SOLN
INTRAMUSCULAR | Status: DC | PRN
Start: 2024-01-02 — End: 2024-01-02
  Administered 2024-01-02: 2 mg via INTRAVENOUS

## 2024-01-02 MED ORDER — SIGHTPATH DOSE#1 BSS IO SOLN
INTRAOCULAR | Status: DC | PRN
  Administered 2024-01-02: 78 mL via OPHTHALMIC

## 2024-01-02 MED ORDER — MOXIFLOXACIN HCL 0.5 % OP SOLN
OPHTHALMIC | Status: DC | PRN
  Administered 2024-01-02: .2 mL via OPHTHALMIC

## 2024-01-02 MED ORDER — SIGHTPATH DOSE#1 BSS IO SOLN
INTRAOCULAR | Status: DC | PRN
  Administered 2024-01-02: 15 mL via INTRAOCULAR

## 2024-01-02 MED ORDER — CYCLOPENTOLATE HCL 2 % OP SOLN
1.0000 [drp] | OPHTHALMIC | Status: AC
  Administered 2024-01-02 (×3): 1 [drp] via OPHTHALMIC

## 2024-01-02 MED ORDER — TETRACAINE HCL 0.5 % OP SOLN
OPHTHALMIC | Status: AC
Start: 2024-01-02 — End: 2024-01-02
  Filled 2024-01-02: qty 4

## 2024-01-02 MED ORDER — FENTANYL CITRATE (PF) 100 MCG/2ML IJ SOLN
INTRAMUSCULAR | Status: DC | PRN
  Administered 2024-01-02: 50 ug via INTRAVENOUS

## 2024-01-02 SURGICAL SUPPLY — 10 items
DISSECTOR HYDRO NUCLEUS 50X22 (MISCELLANEOUS) ×1 IMPLANT
DRSG TEGADERM 2-3/8X2-3/4 SM (GAUZE/BANDAGES/DRESSINGS) ×1 IMPLANT
FEE CATARACT SUITE SIGHTPATH (MISCELLANEOUS) ×1 IMPLANT
GLOVE BIOGEL PI IND STRL 8 (GLOVE) ×1 IMPLANT
GLOVE SURG LX STRL 7.5 STRW (GLOVE) ×1 IMPLANT
GLOVE SURG SYN 6.5 PF PI BL (GLOVE) ×1 IMPLANT
LENS IOL RAYONE EMV 22.0 (Intraocular Lens) IMPLANT
NDL FILTER BLUNT 18X1 1/2 (NEEDLE) ×1 IMPLANT
NEEDLE FILTER BLUNT 18X1 1/2 (NEEDLE) ×1 IMPLANT
SYR 3ML LL SCALE MARK (SYRINGE) ×1 IMPLANT

## 2024-01-02 NOTE — Op Note (Signed)
 OPERATIVE NOTE  Claudia Hunter [id number] 01/02/2024   PREOPERATIVE DIAGNOSIS: Nuclear sclerotic cataract right eye. H25.11   POSTOPERATIVE DIAGNOSIS: Nuclear sclerotic cataract right eye. H25.11   PROCEDURE:  Phacoemulsification with posterior chamber intraocular lens placement of the right eye  Ultrasound time: Procedure(s): PHACOEMULSIFICATION, CATARACT, WITH IOL INSERTION 5.47 00:42.5 (Right)  LENS:   Implant Name Type Inv. Item Serial No. Manufacturer Lot No. LRB No. Used Action  LENS IOL RAYONE EMV 22.0 - [id number] Intraocular Lens LENS IOL RAYONE EMV 22.0 [id number] [employer] [id number] Right 1 Implanted      SURGEON:  Feliciano HERO. Enola, MD   ANESTHESIA:  Topical with tetracaine  drops, augmented with 1% preservative-free intracameral lidocaine .   COMPLICATIONS:  None.   DESCRIPTION OF PROCEDURE:  The patient was identified in the holding room and transported to the operating room and placed in the supine position under the operating microscope.  The right eye was identified as the operative eye, which was prepped and draped in the usual sterile ophthalmic fashion.   A 1 millimeter clear-corneal paracentesis was made superotemporally. Preservative-free 1% lidocaine  mixed with 1:1,000 bisulfite-free aqueous solution of epinephrine  was injected into the anterior chamber. The anterior chamber was then filled with Viscoat viscoelastic. A 2.4 millimeter keratome was used to make a clear-corneal incision inferotemporally. A curvilinear capsulorrhexis was made with a cystotome and capsulorrhexis forceps. Balanced salt  solution was used to hydrodissect and hydrodelineate the nucleus. Phacoemulsification was then used to remove the lens nucleus and epinucleus. The remaining cortex was then removed using the irrigation and aspiration handpiece. Provisc was then placed into the capsular bag to distend it for lens placement. A +22.00 D [id number] intraocular lens was then injected into the  capsular bag. The remaining viscoelastic was aspirated.   Wounds were hydrated with balanced salt  solution.  The anterior chamber was inflated to a physiologic pressure with balanced salt  solution.  No wound leaks were noted. Moxifloxacin  was injected intracamerally.  Timolol  and Brimonidine  drops were applied to the eye.  The patient was taken to the recovery room in stable condition without complications of anesthesia or surgery.  [employer] 01/02/2024, 7:52 AM

## 2024-01-02 NOTE — Anesthesia Postprocedure Evaluation (Signed)
 Anesthesia Post Note  Patient: JENNET SCROGGIN  Procedure(s) Performed: PHACOEMULSIFICATION, CATARACT, WITH IOL INSERTION 5.47 00:42.5 (Right)  Patient location during evaluation: PACU Anesthesia Type: MAC Level of consciousness: awake and alert Pain management: pain level controlled Vital Signs Assessment: post-procedure vital signs reviewed and stable Respiratory status: spontaneous breathing, nonlabored ventilation, respiratory function stable and patient connected to nasal cannula oxygen Cardiovascular status: stable and blood pressure returned to baseline Postop Assessment: no apparent nausea or vomiting Anesthetic complications: no   No notable events documented.   Last Vitals:  Vitals:   01/02/24 0753 01/02/24 0758  BP: (!) 126/104 128/85  Pulse: 71   Resp: 13   Temp: 36.7 C 36.7 C  SpO2: 100%     Last Pain:  Vitals:   01/02/24 0758  PainSc: 0-No pain                 Kristyl Athens C Loyce Klasen

## 2024-01-02 NOTE — H&P (Signed)
 [facility]   Primary Care Physician:  Cleotilde Oneil FALCON, MD Ophthalmologist: Dr. Feliciano Ober  Pre-Procedure History & Physical: HPI:  Claudia Hunter is a 68 y.o. female here for cataract surgery.   Past Medical History:  Diagnosis Date   CKD (chronic kidney disease), stage III (HCC)    Diverticulitis    GERD (gastroesophageal reflux disease)    Gout    Hypertension    Neuropathy    feet and hands   Pinched nerve    Thoracic   Type 2 diabetes mellitus with stage 4 chronic kidney disease (HCC)    Wears dentures    partial upper    Past Surgical History:  Procedure Laterality Date   ABDOMINAL HYSTERECTOMY     CATARACT EXTRACTION W/PHACO Left 01/03/2023   Procedure: CATARACT EXTRACTION PHACO AND INTRAOCULAR LENS PLACEMENT (IOC) LEFT DIABETIC 4.75 00:35.0;  Surgeon: Ober Feliciano Hugger, MD;  Location: [facility];  Service: Ophthalmology;  Laterality: Left;   CESAREAN SECTION     CHOLECYSTECTOMY     COLONOSCOPY N/A 06/12/2018   Procedure: COLONOSCOPY;  Surgeon: Unk Corinn Skiff, MD;  Location: [facility] ENDOSCOPY;  Service: Gastroenterology;  Laterality: N/A;   ESOPHAGOGASTRODUODENOSCOPY N/A 06/12/2018   Procedure: ESOPHAGOGASTRODUODENOSCOPY (EGD);  Surgeon: Unk Corinn Skiff, MD;  Location: [facility] ENDOSCOPY;  Service: Gastroenterology;  Laterality: N/A;   OOPHORECTOMY Right     Prior to Admission medications   Medication Sig Start Date End Date Taking? Authorizing Provider  allopurinol  (ZYLOPRIM ) 300 MG tablet Take 200 mg by mouth daily.   Yes [provider]  amLODipine (NORVASC) 5 MG tablet Take 5 mg by mouth at bedtime.   Yes [provider]  atenolol  (TENORMIN ) 50 MG tablet Take 50 mg by mouth daily.   Yes [provider]  BIOTIN PO Take 1 tablet by mouth daily.   Yes [provider]  Cholecalciferol (VITAMIN D-1000 MAX ST) 1000 units tablet Take by mouth.   Yes [provider]  Cyanocobalamin (RA VITAMIN B-12  TR) 1000 MCG TBCR Take by mouth. Reported on 05/24/2015   Yes [provider]  cyclobenzaprine  (FLEXERIL ) 10 MG tablet at bedtime as needed. 04/15/15  Yes [provider]  dicyclomine (BENTYL) 20 MG tablet Take 20 mg by mouth 3 (three) times daily as needed for spasms.   Yes [provider]  gabapentin  (NEURONTIN ) 100 MG capsule Take 200 mg by mouth 4 (four) times daily. 05/09/18  Yes [provider]  glipiZIDE (GLUCOTROL XL) 2.5 MG 24 hr tablet Take 2.5 mg by mouth daily with breakfast.   Yes [provider]  levothyroxine  (SYNTHROID ) 125 MCG tablet Take 125 mcg by mouth daily before breakfast.   Yes [provider]  magnesium  oxide (MAG-OX) 400 MG tablet Take 400 mg by mouth daily.   Yes [provider]  pantoprazole  (PROTONIX ) 40 MG tablet Take 40 mg by mouth daily.   Yes [provider]  Semaglutide,0.25 or 0.5MG /DOS, 2 MG/3ML SOPN Inject 0.375 mLs into the skin once a week. Fridays   Yes [provider]  sertraline (ZOLOFT) 50 MG tablet Take 50 mg by mouth daily.   Yes [provider]  traMADol  (ULTRAM ) 50 MG tablet Take 50 mg by mouth 3 (three) times daily as needed for moderate pain (pain score 4-6).   Yes [provider]  zolpidem  (AMBIEN ) 10 MG tablet Take 10 mg by mouth at bedtime.  11/12/13  Yes [provider]  pravastatin  (PRAVACHOL ) 20 MG  tablet Take 20 mg by mouth Nightly. 02/18/18 12/31/22  [provider]    Allergies as of 11/29/2023 - Review Complete 01/03/2023  Allergen Reaction Noted   Angiotensin receptor blockers  12/31/2022   Asa [aspirin] Nausea And Vomiting 09/20/2013   Celecoxib  05/03/2015   Clemastine  05/03/2015   Limonene  05/03/2015   Lovastatin Other (See Comments) 02/18/2018   Oxycodone-acetaminophen  Other (See Comments) 07/15/2013   Percocet [oxycodone-acetaminophen ]  09/20/2013    Family History  Problem Relation Age of Onset   Cancer Mother     Heart disease Father    Breast cancer Maternal Aunt        great    Social History   Socioeconomic History   Marital status: Married    Spouse name: Not on file   Number of children: Not on file   Years of education: Not on file   Highest education level: Not on file  Occupational History   Not on file  Tobacco Use   Smoking status: Never   Smokeless tobacco: Never  Vaping Use   Vaping status: Never Used  Substance and Sexual Activity   Alcohol use: No   Drug use: Not on file   Sexual activity: Not on file  Other Topics Concern   Not on file  Social History Narrative   Not on file   Social Drivers of Health   Financial Resource Strain: Low Risk  (09/25/2023)   Received from [facility]   Overall Financial Resource Strain (CARDIA)    Difficulty of Paying Living Expenses: Not hard at all  Food Insecurity: No Food Insecurity (09/25/2023)   Received from [facility]   Hunger Vital Sign    Within the past 12 months, you worried that your food would run out before you got the money to buy more.: Never true    Within the past 12 months, the food you bought just didn't last and you didn't have money to get more.: Never true  Transportation Needs: No Transportation Needs (09/25/2023)   Received from [facility] Transportation    In the past 12 months, has lack of transportation kept you from medical appointments or from getting medications?: No    Lack of Transportation (Non-Medical): No  Physical Activity: Insufficiently Active (11/14/2022)   Received from [facility]   Exercise Vital Sign    On average, how many days per week do you engage in moderate to strenuous exercise (like a brisk walk)?: 7 days    On average, how many minutes do you engage in exercise at this level?: 10 min  Stress: Stress Concern Present (11/14/2022)   Received from [facility] - Occupational Stress Questionnaire    Feeling of Stress : Rather much  Social Connections: Moderately Isolated (11/14/2022)   Received from [facility]   Social Connection and Isolation Panel    In a typical week, how many times do you talk on the phone with family, friends, or neighbors?: More than three times a week    How often do you get together with friends or relatives?: More than three times a week    How often do you attend church or religious services?: More than 4 times per year    Do you belong to any clubs or organizations such as church groups, unions, fraternal or athletic groups, or school groups?:  No    How often do you attend meetings of the clubs or organizations you belong to?: Never    Are you married, widowed, divorced, separated, never married, or living with a partner?: Separated  Intimate Partner Violence: Not on file    Review of Systems: See HPI, otherwise negative ROS  Physical Exam: BP (!) 131/97   Pulse 81   Temp 98.1 F (36.7 C)   Ht 5' 6.5 (1.689 m)   Wt 111.6 kg   SpO2 100%   BMI 39.11 kg/m  General:   Alert, cooperative in NAD Head:  Normocephalic and atraumatic. Respiratory:  Normal work of breathing. Cardiovascular:  RRR  Impression/Plan: Claudia Hunter is here for cataract surgery.  Risks, benefits, limitations, and alternatives regarding cataract surgery have been reviewed with the patient.  Questions have been answered.  All parties agreeable.   Feliciano Bryan Ober, MD  01/02/2024, 7:23 AM

## 2024-01-02 NOTE — Transfer of Care (Signed)
 Immediate Anesthesia Transfer of Care Note  Patient: Claudia Hunter  Procedure(s) Performed: PHACOEMULSIFICATION, CATARACT, WITH IOL INSERTION 5.47 00:42.5 (Right)  Patient Location: PACU  Anesthesia Type: MAC  Level of Consciousness: awake, alert  and patient cooperative  Airway and Oxygen Therapy: Patient Spontanous Breathing   Post-op Assessment: Post-op Vital signs reviewed, Patient's Cardiovascular Status Stable, Respiratory Function Stable, Patent Airway and No signs of Nausea or vomiting  Post-op Vital Signs: Reviewed and stable  Complications: No notable events documented.
